# Patient Record
Sex: Male | Born: 1955 | Race: White | Hispanic: No | Smoking: Current every day smoker
Health system: Southern US, Community
[De-identification: ages and names within clinical notes are randomized; demographics above are authoritative.]

## PROBLEM LIST (undated history)

## (undated) DIAGNOSIS — K30 Functional dyspepsia: Secondary | ICD-10-CM

## (undated) DIAGNOSIS — K219 Gastro-esophageal reflux disease without esophagitis: Secondary | ICD-10-CM

## (undated) DIAGNOSIS — K210 Gastro-esophageal reflux disease with esophagitis: Secondary | ICD-10-CM

## (undated) DIAGNOSIS — R0602 Shortness of breath: Secondary | ICD-10-CM

## (undated) DIAGNOSIS — I10 Essential (primary) hypertension: Secondary | ICD-10-CM

## (undated) DIAGNOSIS — J449 Chronic obstructive pulmonary disease, unspecified: Secondary | ICD-10-CM

## (undated) DIAGNOSIS — R51 Headache: Secondary | ICD-10-CM

## (undated) HISTORY — PX: MANDIBLE FRACTURE SURGERY: SHX706

## (undated) HISTORY — PX: LAPAROSCOPIC GASTROTOMY W/ REPAIR OF ULCER: SUR772

---

## 2000-10-26 ENCOUNTER — Encounter: Payer: Self-pay | Admitting: Emergency Medicine

## 2000-10-26 ENCOUNTER — Emergency Department (HOSPITAL_COMMUNITY): Admission: EM | Admit: 2000-10-26 | Discharge: 2000-10-26 | Payer: Self-pay | Admitting: Unknown Physician Specialty

## 2005-10-24 ENCOUNTER — Ambulatory Visit (HOSPITAL_COMMUNITY): Admission: EM | Admit: 2005-10-24 | Discharge: 2005-10-25 | Payer: Self-pay | Admitting: Emergency Medicine

## 2005-12-06 ENCOUNTER — Ambulatory Visit (HOSPITAL_COMMUNITY): Admission: RE | Admit: 2005-12-06 | Discharge: 2005-12-06 | Payer: Self-pay | Admitting: Otolaryngology

## 2005-12-11 ENCOUNTER — Ambulatory Visit (HOSPITAL_BASED_OUTPATIENT_CLINIC_OR_DEPARTMENT_OTHER): Admission: RE | Admit: 2005-12-11 | Discharge: 2005-12-11 | Payer: Self-pay | Admitting: Otolaryngology

## 2008-12-24 ENCOUNTER — Emergency Department (HOSPITAL_COMMUNITY): Admission: EM | Admit: 2008-12-24 | Discharge: 2008-12-24 | Payer: Self-pay | Admitting: Emergency Medicine

## 2009-01-15 ENCOUNTER — Encounter (INDEPENDENT_AMBULATORY_CARE_PROVIDER_SITE_OTHER): Payer: Self-pay | Admitting: Family Medicine

## 2009-01-15 ENCOUNTER — Ambulatory Visit: Payer: Self-pay | Admitting: Internal Medicine

## 2009-01-15 LAB — CONVERTED CEMR LAB: Microalb, Ur: 4.04 mg/dL — ABNORMAL HIGH (ref 0.00–1.89)

## 2009-01-20 ENCOUNTER — Ambulatory Visit: Payer: Self-pay | Admitting: *Deleted

## 2009-06-17 ENCOUNTER — Ambulatory Visit: Payer: Self-pay | Admitting: Family Medicine

## 2009-06-17 LAB — CONVERTED CEMR LAB
ALT: 47 units/L (ref 0–53)
Albumin: 4 g/dL (ref 3.5–5.2)
CO2: 20 meq/L (ref 19–32)
Cholesterol: 212 mg/dL — ABNORMAL HIGH (ref 0–200)
Glucose, Bld: 423 mg/dL — ABNORMAL HIGH (ref 70–99)
Hemoglobin: 17.2 g/dL — ABNORMAL HIGH (ref 13.0–17.0)
Lymphocytes Relative: 31 % (ref 12–46)
Lymphs Abs: 3.5 10*3/uL (ref 0.7–4.0)
MCHC: 34.4 g/dL (ref 30.0–36.0)
Microalb, Ur: 3.2 mg/dL — ABNORMAL HIGH (ref 0.00–1.89)
Monocytes Absolute: 1.3 10*3/uL — ABNORMAL HIGH (ref 0.1–1.0)
Monocytes Relative: 12 % (ref 3–12)
Neutro Abs: 6.3 10*3/uL (ref 1.7–7.7)
Potassium: 4.3 meq/L (ref 3.5–5.3)
RBC: 5.22 M/uL (ref 4.22–5.81)
Sodium: 133 meq/L — ABNORMAL LOW (ref 135–145)
Total Protein: 7.3 g/dL (ref 6.0–8.3)
Triglycerides: 782 mg/dL — ABNORMAL HIGH (ref <150)
Vit D, 25-Hydroxy: 14 ng/mL — ABNORMAL LOW (ref 30–89)
WBC: 11.4 10*3/uL — ABNORMAL HIGH (ref 4.0–10.5)

## 2009-06-22 ENCOUNTER — Ambulatory Visit: Payer: Self-pay | Admitting: Family Medicine

## 2010-06-10 ENCOUNTER — Emergency Department (HOSPITAL_COMMUNITY): Payer: Self-pay

## 2010-06-10 ENCOUNTER — Inpatient Hospital Stay (HOSPITAL_COMMUNITY)
Admission: EM | Admit: 2010-06-10 | Discharge: 2010-06-16 | DRG: 639 | Disposition: A | Discharge status: Home / Self Care | Payer: Self-pay | Attending: Internal Medicine | Admitting: Internal Medicine

## 2010-06-10 DIAGNOSIS — E785 Hyperlipidemia, unspecified: Secondary | ICD-10-CM | POA: Diagnosis present

## 2010-06-10 DIAGNOSIS — F17200 Nicotine dependence, unspecified, uncomplicated: Secondary | ICD-10-CM | POA: Diagnosis present

## 2010-06-10 DIAGNOSIS — Z9119 Patient's noncompliance with other medical treatment and regimen: Secondary | ICD-10-CM

## 2010-06-10 DIAGNOSIS — D1739 Benign lipomatous neoplasm of skin and subcutaneous tissue of other sites: Secondary | ICD-10-CM | POA: Diagnosis present

## 2010-06-10 DIAGNOSIS — E1100 Type 2 diabetes mellitus with hyperosmolarity without nonketotic hyperglycemic-hyperosmolar coma (NKHHC): Principal | ICD-10-CM | POA: Diagnosis present

## 2010-06-10 DIAGNOSIS — I10 Essential (primary) hypertension: Secondary | ICD-10-CM | POA: Diagnosis present

## 2010-06-10 DIAGNOSIS — J449 Chronic obstructive pulmonary disease, unspecified: Secondary | ICD-10-CM | POA: Diagnosis present

## 2010-06-10 DIAGNOSIS — F1010 Alcohol abuse, uncomplicated: Secondary | ICD-10-CM | POA: Diagnosis present

## 2010-06-10 DIAGNOSIS — Z8711 Personal history of peptic ulcer disease: Secondary | ICD-10-CM

## 2010-06-10 DIAGNOSIS — Z91199 Patient's noncompliance with other medical treatment and regimen due to unspecified reason: Secondary | ICD-10-CM

## 2010-06-10 DIAGNOSIS — K219 Gastro-esophageal reflux disease without esophagitis: Secondary | ICD-10-CM | POA: Diagnosis present

## 2010-06-10 DIAGNOSIS — J4489 Other specified chronic obstructive pulmonary disease: Secondary | ICD-10-CM | POA: Diagnosis present

## 2010-06-10 DIAGNOSIS — F1410 Cocaine abuse, uncomplicated: Secondary | ICD-10-CM | POA: Diagnosis present

## 2010-06-10 LAB — BLOOD GAS, VENOUS
Bicarbonate: 22.4 mEq/L (ref 20.0–24.0)
Patient temperature: 98.6
pH, Ven: 7.434 — ABNORMAL HIGH (ref 7.250–7.300)
pO2, Ven: 55.7 mmHg — ABNORMAL HIGH (ref 30.0–45.0)

## 2010-06-10 LAB — GLUCOSE, CAPILLARY
Glucose-Capillary: 340 mg/dL — ABNORMAL HIGH (ref 70–99)
Glucose-Capillary: 285 mg/dL — ABNORMAL HIGH (ref 70–99)
Glucose-Capillary: 187 mg/dL — ABNORMAL HIGH (ref 70–99)

## 2010-06-10 LAB — TYPE AND SCREEN
ABO/RH(D): O POS
Antibody Screen: NEGATIVE

## 2010-06-10 LAB — COMPREHENSIVE METABOLIC PANEL
ALT: 78 U/L — ABNORMAL HIGH (ref 0–53)
Alkaline Phosphatase: 184 U/L — ABNORMAL HIGH (ref 39–117)
BUN: 13 mg/dL (ref 6–23)
CO2: 21 mEq/L (ref 19–32)
Chloride: 96 mEq/L (ref 96–112)
Glucose, Bld: 573 mg/dL (ref 70–99)
Potassium: 4.4 mEq/L (ref 3.5–5.1)
Sodium: 125 mEq/L — ABNORMAL LOW (ref 135–145)
Total Bilirubin: 0.9 mg/dL (ref 0.3–1.2)
Total Protein: 6.8 g/dL (ref 6.0–8.3)

## 2010-06-10 LAB — URINALYSIS, ROUTINE W REFLEX MICROSCOPIC
Bilirubin Urine: NEGATIVE
Hgb urine dipstick: NEGATIVE
Ketones, ur: NEGATIVE mg/dL
Nitrite: NEGATIVE
Specific Gravity, Urine: 1.035 — ABNORMAL HIGH (ref 1.005–1.030)
Urobilinogen, UA: 0.2 mg/dL (ref 0.0–1.0)

## 2010-06-10 LAB — LIPASE, BLOOD: Lipase: 35 U/L (ref 11–59)

## 2010-06-10 LAB — DIFFERENTIAL
Basophils Relative: 1 % (ref 0–1)
Eosinophils Absolute: 0.1 10*3/uL (ref 0.0–0.7)
Monocytes Relative: 10 % (ref 3–12)
Neutro Abs: 4.5 10*3/uL (ref 1.7–7.7)
Neutrophils Relative %: 55 % (ref 43–77)

## 2010-06-10 LAB — RAPID URINE DRUG SCREEN, HOSP PERFORMED
Amphetamines: NOT DETECTED
Barbiturates: NOT DETECTED
Opiates: NOT DETECTED

## 2010-06-10 LAB — CBC
Hemoglobin: 16.7 g/dL (ref 13.0–17.0)
MCH: 34.2 pg — ABNORMAL HIGH (ref 26.0–34.0)
MCHC: 36.9 g/dL — ABNORMAL HIGH (ref 30.0–36.0)
Platelets: 154 10*3/uL (ref 150–400)
RDW: 11.6 % (ref 11.5–15.5)

## 2010-06-10 LAB — ETHANOL: Alcohol, Ethyl (B): <5 mg/dL (ref 0–10)

## 2010-06-10 LAB — URINE MICROSCOPIC-ADD ON

## 2010-06-10 LAB — HEMOCCULT GUIAC POC 1CARD (OFFICE): Fecal Occult Bld: NEGATIVE

## 2010-06-10 LAB — CK TOTAL AND CKMB (NOT AT ARMC): CK, MB: 1.9 ng/mL (ref 0.3–4.0)

## 2010-06-10 LAB — TROPONIN I: Troponin I: 0.02 ng/mL (ref 0.00–0.06)

## 2010-06-11 ENCOUNTER — Inpatient Hospital Stay (HOSPITAL_COMMUNITY): Payer: Self-pay

## 2010-06-11 DIAGNOSIS — R0602 Shortness of breath: Secondary | ICD-10-CM

## 2010-06-11 LAB — LIPID PANEL
Triglycerides: 355 mg/dL — ABNORMAL HIGH (ref <150)
VLDL: 71 mg/dL — ABNORMAL HIGH (ref 0–40)

## 2010-06-11 LAB — CARDIAC PANEL(CRET KIN+CKTOT+MB+TROPI)
CK, MB: 1.2 ng/mL (ref 0.3–4.0)
CK, MB: 1.3 ng/mL (ref 0.3–4.0)
Relative Index: INVALID (ref 0.0–2.5)
Relative Index: INVALID (ref 0.0–2.5)
Total CK: 54 U/L (ref 7–232)
Total CK: 53 U/L (ref 7–232)
Troponin I: 0.01 ng/mL (ref 0.00–0.06)
Troponin I: 0.01 ng/mL (ref 0.00–0.06)

## 2010-06-11 LAB — HEMOGLOBIN A1C: Mean Plasma Glucose: 303 mg/dL — ABNORMAL HIGH (ref <117)

## 2010-06-11 LAB — COMPREHENSIVE METABOLIC PANEL
BUN: 8 mg/dL (ref 6–23)
CO2: 23 mEq/L (ref 19–32)
Chloride: 104 mEq/L (ref 96–112)
Creatinine, Ser: 0.8 mg/dL (ref 0.4–1.5)
GFR calc non Af Amer: >60 mL/min (ref >60)
Glucose, Bld: 132 mg/dL — ABNORMAL HIGH (ref 70–99)
Total Bilirubin: 0.7 mg/dL (ref 0.3–1.2)

## 2010-06-11 LAB — GLUCOSE, CAPILLARY
Glucose-Capillary: 126 mg/dL — ABNORMAL HIGH (ref 70–99)
Glucose-Capillary: 127 mg/dL — ABNORMAL HIGH (ref 70–99)
Glucose-Capillary: 170 mg/dL — ABNORMAL HIGH (ref 70–99)
Glucose-Capillary: 190 mg/dL — ABNORMAL HIGH (ref 70–99)
Glucose-Capillary: 216 mg/dL — ABNORMAL HIGH (ref 70–99)
Glucose-Capillary: 236 mg/dL — ABNORMAL HIGH (ref 70–99)

## 2010-06-11 LAB — CBC
Hemoglobin: 14.6 g/dL (ref 13.0–17.0)
MCH: 33.3 pg (ref 26.0–34.0)
MCV: 92.9 fL (ref 78.0–100.0)
Platelets: 134 10*3/uL — ABNORMAL LOW (ref 150–400)
RBC: 4.39 MIL/uL (ref 4.22–5.81)

## 2010-06-11 LAB — ABO/RH: ABO/RH(D): O POS

## 2010-06-11 MED ORDER — IOHEXOL 300 MG/ML  SOLN
125.0000 mL | Freq: Once | INTRAMUSCULAR | Status: AC | PRN
  Administered 2010-06-11: 125 mL via INTRAVENOUS

## 2010-06-12 LAB — CBC
MCH: 33.8 pg (ref 26.0–34.0)
MCHC: 36.4 g/dL — ABNORMAL HIGH (ref 30.0–36.0)
Platelets: 134 10*3/uL — ABNORMAL LOW (ref 150–400)

## 2010-06-12 LAB — BASIC METABOLIC PANEL
Calcium: 8.9 mg/dL (ref 8.4–10.5)
Creatinine, Ser: 0.85 mg/dL (ref 0.4–1.5)
GFR calc Af Amer: >60 mL/min (ref >60)
GFR calc non Af Amer: >60 mL/min (ref >60)
Sodium: 136 mEq/L (ref 135–145)

## 2010-06-12 LAB — GLUCOSE, CAPILLARY
Glucose-Capillary: 235 mg/dL — ABNORMAL HIGH (ref 70–99)
Glucose-Capillary: 254 mg/dL — ABNORMAL HIGH (ref 70–99)
Glucose-Capillary: 282 mg/dL — ABNORMAL HIGH (ref 70–99)
Glucose-Capillary: 288 mg/dL — ABNORMAL HIGH (ref 70–99)

## 2010-06-13 LAB — GLUCOSE, CAPILLARY
Glucose-Capillary: 240 mg/dL — ABNORMAL HIGH (ref 70–99)
Glucose-Capillary: 295 mg/dL — ABNORMAL HIGH (ref 70–99)
Glucose-Capillary: 305 mg/dL — ABNORMAL HIGH (ref 70–99)
Glucose-Capillary: 317 mg/dL — ABNORMAL HIGH (ref 70–99)
Glucose-Capillary: 344 mg/dL — ABNORMAL HIGH (ref 70–99)

## 2010-06-13 LAB — BASIC METABOLIC PANEL
BUN: 6 mg/dL (ref 6–23)
CO2: 25 mEq/L (ref 19–32)
Calcium: 9.8 mg/dL (ref 8.4–10.5)
Chloride: 102 mEq/L (ref 96–112)
Creatinine, Ser: 0.93 mg/dL (ref 0.4–1.5)
GFR calc Af Amer: >60 mL/min (ref >60)
GFR calc non Af Amer: >60 mL/min (ref >60)
Glucose, Bld: 292 mg/dL — ABNORMAL HIGH (ref 70–99)
Potassium: 3.3 mEq/L — ABNORMAL LOW (ref 3.5–5.1)
Sodium: 136 mEq/L (ref 135–145)

## 2010-06-14 LAB — GLUCOSE, CAPILLARY
Glucose-Capillary: 325 mg/dL — ABNORMAL HIGH (ref 70–99)
Glucose-Capillary: 366 mg/dL — ABNORMAL HIGH (ref 70–99)
Glucose-Capillary: 318 mg/dL — ABNORMAL HIGH (ref 70–99)

## 2010-06-14 LAB — COMPREHENSIVE METABOLIC PANEL
Albumin: 3.6 g/dL (ref 3.5–5.2)
BUN: 13 mg/dL (ref 6–23)
Calcium: 10.2 mg/dL (ref 8.4–10.5)
Chloride: 100 mEq/L (ref 96–112)
Creatinine, Ser: 0.97 mg/dL (ref 0.4–1.5)
GFR calc non Af Amer: >60 mL/min (ref >60)
Total Bilirubin: 0.9 mg/dL (ref 0.3–1.2)

## 2010-06-15 LAB — GLUCOSE, CAPILLARY
Glucose-Capillary: 303 mg/dL — ABNORMAL HIGH (ref 70–99)
Glucose-Capillary: 363 mg/dL — ABNORMAL HIGH (ref 70–99)
Glucose-Capillary: 218 mg/dL — ABNORMAL HIGH (ref 70–99)
Glucose-Capillary: 295 mg/dL — ABNORMAL HIGH (ref 70–99)

## 2010-06-15 NOTE — H&P (Signed)
NAME:  Carl Cooke, Carl Cooke                ACCOUNT NO.:  [id number]  MEDICAL RECORD NO.:  [id number]           PATIENT TYPE:  E  LOCATION:  WLED                         FACILITY:  [facility]  PHYSICIAN:  Thad Ranger, MD       DATE OF BIRTH:  12/14/1955  DATE OF ADMISSION:  06/10/2010 DATE OF DISCHARGE:                             HISTORY & PHYSICAL   PRIMARY CARE PHYSICIAN:  [facility]  CHIEF COMPLAINT:  High blood sugars and dizziness.  HISTORY OF PRESENT ILLNESS:  Carl Cooke is a 55 year old male with history of hypertension, diabetes mellitus type 2, hyperlipidemia, history of peptic ulcer disease, presented to the [facility] Emergency Room with chief complaint of high blood sugars.  History was provided by the patient and he states that he has ran out of his diabetes and all other medications.  He states that he has seen his physician last time was 1 year ago.  He states that his blood sugar has been elevated for last 10 months and in the last 2 weeks have been un-recordable.  He states that he cannot afford any of his medications.  He also states that he has been having some dizziness and lightheadedness for the last couple of weeks and today was unable to stand up.  He denies any syncopal episode.  He also endorses having off and on epigastric abdominal pain over the last 6 months.  He at this time states that the pain is about 6/10 intensity, generalized, but worse in epigastric region.  He has been having nausea every time he eats with vomiting and has been unable to hold the food down.  He states that 2 days ago he had some coffee-ground emesis.  He also had one episode of melanotic stools 2 days ago.  He states that he had an EGD 10 years ago, which had shown "stomach ulcer."  He was placed on Prilosec, but he ran out of all the medications.  He endorses having some shortness of breath on exertion. He states that he has been able to walk only half a block over the last 6-7  months.  He is a chronic smoker with heavy smoking, but not on any inhalers.  He also endorses having chronic headaches, neck pain.  Urine drug use in the emergency room was also positive for cocaine, which he used yesterday.  In the emergency room, patient was found to have blood sugar of 433.  PAST MEDICAL HISTORY: 1. Hypertension. 2. Diabetes mellitus type 2, 3. Hyperlipidemia. 4. Peptic ulcer disease. 5. History of polysubstance abuse with smoking, alcohol and cocaine     use.  ALLERGIES:  No known drug allergies.  MEDICATIONS:  None.  PHYSICAL EXAMINATION:  VITAL SIGNS:  Blood pressure 141/91, pulse rate 75, respiratory rate 28, temperature 98.3. GENERAL:  Patient is alert, awake and oriented x3, not in any acute distress. HEENT:  Anicteric sclarea.  Conjunctivae pink.  Pupils are equal and reactive to light and accommodation.  EOMI. NECK:  Supple.  Lipoma noted on the upper back. CHEST:  Clear to auscultation bilaterally. CVS:  S1, S2 clear with regular  rhythm. ABDOMEN:  Generalized tenderness to palpation,worse in epigastric region, soft, normal bowel sounds. EXTREMITIES:  No cyanosis, clubbing or edema noted bilaterally. NEUROLOGIC:  No focal neurological deficits noted.  Cranial II through XII intact.  DIAGNOSTIC STUDIES AND LABORATORY DATA:  Urine drug screen positive for cocaine.  CMP:  Sodium is 125, corrected sodium is 133 with glucose of 573, BUN 13, creatinine 1.01, alkaline phosphatase 184, AST 53, ALT 78, total bilirubin 0.9, calcium 9.6, albumin 3.7.  Alcohol level less than 5.  Lipase 35.  Urinalysis:  More than 1000 glucose, negative for UTI. CBC:  White count of 8.3, hemoglobin 16.7, hematocrit 45.3, platelets 154.  Fecal occult blood negative.  CK 83, MB 1.9.  Troponin 0.02. ketones negative.  Radiological data:  Acute abdominal series: 1. No acute abdominal abnormality. 2. Stable chronic bronchitis and/or asthma.  No acute cardiopulmonary      disease.  IMPRESSION AND PLAN:  Carl Cooke is a 55 year old male with known history of noncompliance, polysubstance abuse, history of diabetes mellitus, hypertension, hyperlipidemia and peptic ulcer disease, not on any medications, presenting with hyperglycemia, dizziness as well as abdominal pain. 1. Hyperosmolar nonketotic hyperglycemia with uncontrolled diabetes     mellitus:  Patient will be admitted, placed on intravenous insulin     and glucose stabilizer until blood sugars are better controlled.     Patient needs extensive diabetes teaching and compliance diabetic     diet.  He will need assistance with medications prior to the     discharge.  For now, we will continue on clear liquid diet. 2. Dyspnea on exertion with possible chronic obstructive pulmonary     disease and active tobacco abuse:  Patient has chronic dyspnea on     exertion, is a heavy smoker, but does not carry a diagnosis of     chronic obstructive pulmonary disease, which is likely present in     this patient.  For now, I will place him on nebulizer treatment.     He was counseled strongly on smoking cessation and should have     pulmonary function tests and home O2 evaluation prior to discharge. 3. Alcohol abuse with last drink yesterday:  Patient was also     counseled strongly on alcohol cessation.  For now, I will place him     on Ativan with [facility] Withdrawal Assessment protocol to     avoid withdrawals. 4. Abdominal pain with questionable hematemesis and melanotic stools:     Patient does have a history of peptic ulcer disease and was     prescribed proton pump inhibitor.  Given his generalized abdominal     pain, I will obtain computed tomography of abdomen and pelvis to     rule out any acute abdominal pathology, place on proton pump     inhibitor and clear.  He is currently hemodynamically stable and is     not having an active bleeding.  Consider gastrointestinal consult     in a.m. 5.  Hypertension, currently stable:  Place on p.r.n. hydralazine. 6. History of cocaine abuse:  Patient was again strongly counseled on     polysubstance abuse and we will also place substance abuse     counselor for cocaine cessation. 7. Prophylaxis:  Place on proton pump inhibitor and bilateral SCDs.     Thad Ranger, MD     RR/MEDQ  D:  06/10/2010  T:  06/10/2010  Job:  [id number]  Electronically Signed by Andres Labrum RAI  on 06/15/2010 03:17:18 PM

## 2010-06-16 LAB — COMPREHENSIVE METABOLIC PANEL
ALT: 40 U/L (ref 0–53)
AST: 30 U/L (ref 0–37)
Albumin: 3.4 g/dL — ABNORMAL LOW (ref 3.5–5.2)
Alkaline Phosphatase: 129 U/L — ABNORMAL HIGH (ref 39–117)
BUN: 19 mg/dL (ref 6–23)
Chloride: 101 mEq/L (ref 96–112)
Potassium: 4.2 mEq/L (ref 3.5–5.1)
Sodium: 134 mEq/L — ABNORMAL LOW (ref 135–145)
Total Bilirubin: 0.5 mg/dL (ref 0.3–1.2)
Total Protein: 6.9 g/dL (ref 6.0–8.3)

## 2010-06-16 LAB — GLUCOSE, CAPILLARY: Glucose-Capillary: >600 mg/dL (ref 70–99)

## 2010-06-17 NOTE — Progress Notes (Signed)
NAMESHJON, LIZARRAGA                ACCOUNT NO.:  [id number]  MEDICAL RECORD NO.:  [id number]           PATIENT TYPE:  I  LOCATION:  1514                         FACILITY:  [facility]  PHYSICIAN:  Pleas Koch, MD        DATE OF BIRTH:  November 11, 1955                                PROGRESS NOTE   CURRENT DIAGNOSES: 1. Hyperosmolar nonketotic state. 2. Chronic obstructive pulmonary disease. 3. Alcohol use. 4. Hypertension. 5. Possible likely gastroesophageal reflux. 6. Lipoma upper back.  DISCHARGE MEDICATIONS: Will be prescribed as per discharging physician's note.  PERTINENT IMAGING: 1. Acute abdominal series showed no acute abnormality.  Stable chronic     bronchitis and/or asthma.  No acute cardiopulmonary disease. 2. CT of the abdomen done June 11, 2010, showed hepatomegaly,     diffuse fatty infiltration of the liver and also showed mildly     enlarged upper abdominal lymph node, most likely reactive but     consider a CT followup in 3 months.  BRIEF HOSPITAL COURSE TO DATE: This is a 55 year old male with hypertension, diabetes mellitus type 2, hyperlipidemia, history of peptic ulcer disease who presented with a chief complaint of high blood sugar.  The patient states that he ran out of all of his medications.  He is diabetic and has run out of all of his medications and the last time that he saw a physician was 1 year ago. He states that blood sugars have been elevated for the past 10 months and for the last two weeks they have been not recorded.  He states that he cannot afford his medications.  He has also had some dizziness and lightheadedness.  He has been unable to stand up.  He denied any syncopal episode and has been having abdominal epigastric pain for the past 6 months.  He states that it is more in his epigastric region and he has had nausea every time he eats.  Two days ago he had some coffee ground emesis and also had one episode of melanotic stools two  days prior to admission.  He had an EGD 10 years ago, which showed a peptic ulcer and he was placed on Prilosec, but ran out of all of this.  He also states that he has been having some shortness of breath on exertion.  He is a chronic smoker, heavy smoking, but does not use any inhalers.  The patient also complains of having neck pain, likely secondary to a lipoma.  In the ER he was found to be positive for cocaine, which he apparently used yesterday.  PHYSICAL EXAMINATION ON ADMISSION: VITAL SIGNS:  Blood pressure 141/91, pulse rate 75, respirations 28, temperature 98.3. GENERAL:  Alert and oriented. SKIN:  He has a lipoma noted on the upper back about 10 cm around. ABDOMEN:  Generalized tenderness to palpation of the abdomen, worse in the epigastric region.  Normal soft bowel sounds. CARDIAC:  S1, S2.  No murmurs, rubs or gallops.  LABORATORY DATA: Admission drug screen was positive for cocaine.  CMP showed sodium was 133, glucose 570, BUN was 13, creatinine was 1.01, alkaline  phosphatase 184.  Transaminase AST was 53, ALT 78, total bilirubin 0.9, calcium 9.6, albumin 3.7.  Alcohol was less than 5.  Lipase was 35.  Glucose more than 1000.  Negative for UTI.  His white count was 8.3, hemoglobin 16.7, hematocrit 45.3, platelets 154.  Fecal occult blood negative.  Cardiac enzymes were negative.  HOSPITAL COURSE ACCORDING TO ISSUE: 1. The patient was in hyperosmolar nonketotic state.  He was placed on     Glucommander initially but quickly had sugars below 200s to 300s.     It has been difficult to manage his blood sugar in the hospital and     he has required escalating doses of insulin.  Please note that he     would not be a candidate for orals, given the fact that his HbA1c     was above 12.2.  He will likely need help with medications, which     we are trying to arrange, however, and he has been set up with     [facility] for followup.  However, he will need to have  a     good set up plan in place before discharge.  I have increased his     Lantus today to 32 units q.p.m. and mealtime coverage 8 units of     NovoLog.  He will also be on sliding-scale insulin to control his     blood sugar. 2. Abdominal pain.  This is likely secondary to his underlying reflux     disease.  I have placed him on Zantac and omeprazole but he still     has some pain.  We may be able to add sucralfate to help with his     medications.  I would recommend that if he still has pain. 3. COPD.  He came in with a strong smoking history, but has not     demonstrated any wheeze.  He has not really needed any inhalers     here.  However, his wife expressed concern about this and hence may     need albuterol p.r.n. on discharge. 4. Ethanol use.  The patient was counseled to quit drinking.  He     states that he does not drink that often.  He was kept on CIWA     scale initially transiently.  However, is doing fairly without that     right now and this can likely be discontinued. 5. Hypertension.  Note that his blood pressures on arrival were     elevated.  I have placed him on lisinopril/HCTZ.  Hopefully he can     go home on a combination medication regimen of these two     medications. 6. Lipoma.  The patient requested inpatient evaluation of this.     However, I have verbally spoken to PA, who recommended this being     done as an outpatient.  Vital signs on day of review were temperature 97.8, pulse was 75, respirations were 20, blood pressure was 116-130/73-91.  The patient was saturating 93% on room air.          ______________________________ Pleas Koch, MD     JS/MEDQ  D:  06/14/2010  T:  06/14/2010  Job:  [id number]  Electronically Signed by Pleas Koch MD on 06/17/2010 03:13:14 PM

## 2010-07-11 NOTE — Discharge Summary (Signed)
Carl Cooke, AGER NO.:  [id number]  MEDICAL RECORD NO.:  [id number]           PATIENT TYPE:  I  LOCATION:  1514                         FACILITY:  [facility]  PHYSICIAN:  Pincus Large, MD     DATE OF BIRTH:  [birth year]  DATE OF ADMISSION:  06/10/2010 DATE OF DISCHARGE:  06/16/2010                              DISCHARGE SUMMARY   PRIMARY CARE PHYSICIAN:  [facility].  TIME SPENT:  Time spent on discharge summary was 35 minutes.  DISCHARGE DIAGNOSES: 1. Hyperosmolar nonketotic state, resolved. 2. Diabetes type 2. 3. History of chronic obstructive pulmonary disease. 4. History of alcohol abuse. 5. Hypertension. 6. Gastroesophageal reflux disease. 7. Lipoma on the upper back.  DISCHARGE MEDICATIONS: 1. Albuterol 90 mcg inhaler 1 puff q.4 p.r.n. 2. Hydrochlorothiazide 12.5 daily. 3. Insulin NovoLog sliding scale. 4. Lantus 38 units q.h.s. 5. Ipratropium 0.2 mg per mL twice a day. 6. Lisinopril 10 daily. 7. Nicotine 21 mg q.24 h. 8. Protonix 40 daily. 9. Sucralfate 1 g by mouth twice daily.  IMAGING:  Current images that were done are: 1. Acute abdominal series showing no acute abnormalities. 2. CT of the abdomen on February 11th shows hepatomegaly, diffuse     fatty infiltration of the liver and showed mild enlarged upper     abdominal lymph nodes, most likely active, but consider repeated CT     in 3 months.  HOSPITAL COURSE:  He is a 55 year old male with hypertension, diabetes type 2, hyperlipidemia, history of peptic ulcer disease who presented with chief complaint of high blood sugar.  The patient states that he ran out of his home medication and the last time he saw a physician was 1 year ago.  His blood sugar has been elevated for the last 2 weeks. They have not been recorded.  He stated also that he cannot afford the medication.  He had some dizziness and lightheaded.  He was unable to stand up.  Denies any syncopal episode.  He also has had  some mild epigastric pain.  ADMISSION LABORATORY DATA:  Positive for cocaine.  His CMP shows sodium was 133, glucose was 570, BUN 13, creatinine was 1.01, alkaline phosphatase 182, AST was 53, ALT was 78, total bili was 0.9, calcium was 9.6.  Alcohol level was less than 5, lipase was 35.  Glucose was more than 1000 and negative for UTI on the urine.  His white blood cell was 8.3, hemoglobin was 16.7, and hematocrit was 45.3.  PHYSICAL EXAMINATION:  GENERAL:  The patient is awake and oriented x3, lying in bed, in on acute distress. HEENT:  Normocephalic, atraumatic.  Conjunctivae pink.  Pupils are equal and reactive to light. CARDIAC:  S1 and S2 are normal.  No gallop or rub. LUNGS:  Bilateral air entry.  No wheezes or crackles. ABDOMEN:  Soft.  Bowel sounds present. MUSCULOSKELETAL:  Motor strength of 5/5 in all extremities.  HOSPITAL COURSE ACCORDING TO PROBLEM: 1. The patient was in hyperosmolar nonketotic state.  He was placed on     Glucommander initially, but quickly sugar went down to low 200s to  300s.  It has been difficult to manage his blood sugar.  The     patient's A1c was 12.2.  His Lantus was increased up to 38 units.     Also, the patient was started on NovoLog premeal 8 units.  I have     asked the patient to follow up with his primary care at __________     in 3 months. 2. Abdominal pain, most likely esophageal reflux.  The patient was     started on omeprazole and Zantac and also sucralfate was added and     the patient felt much better. 3. Possible underlying chronic obstructive pulmonary disease.  The     patient was discharged on bronchodilator inhaler. 4. History of alcohol abuse.  The patient was counseled to quit     drinking. 5. Hypertension.  Lisinopril/hydrochlorothiazide was given to the     patient and his blood pressure was fairly controlled. 6. Lipoma.  The patient is recommended to follow up with the surgeon     as an outpatient.           ______________________________ Pincus Large, MD     SA/MEDQ  D:  06/16/2010  T:  06/17/2010  Job:  [id number]  Electronically Signed by Pincus Large MD on 07/11/2010 05:57:59 PM

## 2010-08-06 LAB — POCT I-STAT, CHEM 8
BUN: 10 mg/dL (ref 6–23)
Hemoglobin: 18 g/dL — ABNORMAL HIGH (ref 13.0–17.0)
Potassium: 4.2 mEq/L (ref 3.5–5.1)
Sodium: 130 mEq/L — ABNORMAL LOW (ref 135–145)
TCO2: 22 mmol/L (ref 0–100)

## 2010-08-06 LAB — GLUCOSE, CAPILLARY
Glucose-Capillary: 243 mg/dL — ABNORMAL HIGH (ref 70–99)
Glucose-Capillary: 577 mg/dL (ref 70–99)

## 2010-08-06 LAB — URINALYSIS, ROUTINE W REFLEX MICROSCOPIC
Bilirubin Urine: NEGATIVE
Glucose, UA: >1000 mg/dL — AB
Hgb urine dipstick: NEGATIVE
Protein, ur: NEGATIVE mg/dL
Urobilinogen, UA: 0.2 mg/dL (ref 0.0–1.0)

## 2010-08-06 LAB — KETONES, QUALITATIVE: Acetone, Bld: NEGATIVE

## 2010-08-06 LAB — URINE MICROSCOPIC-ADD ON
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
## 2010-09-16 NOTE — Op Note (Signed)
Carl Cooke, Carl Cooke                ACCOUNT NO.:  [id number]   MEDICAL RECORD NO.:  [id number]          PATIENT TYPE:  AMB   LOCATION:  DSC                          FACILITY:  [facility]   PHYSICIAN:  Karol T. Lazarus Salines, M.D. DATE OF BIRTH:  February 06, 1956   DATE OF PROCEDURE:  12/11/2005  DATE OF DISCHARGE:                                 OPERATIVE REPORT   PREOPERATIVE DIAGNOSIS:  Right subcondylar mandible fracture.   POSTOPERATIVE DIAGNOSIS:  Right subcondylar mandible fracture.   PROCEDURE PERFORMED:  Removal intermaxillary fixation.   SURGEON:  Gloris Manchester. Lazarus Salines, M.D.   ANESTHESIA:  General mask converted to general orotracheal.   BLOOD LOSS:  None.   COMPLICATIONS:  None.   FINDINGS:  Poor dentition with decent occlusion.  Routine wires and  bimaxillary bicortical screws removed without difficulty.   PROCEDURE:  With the patient in the comfortable supine position, general  mask anesthesia was administered.  At an appropriate level, the four wires  were cut and removed.  The jaw was able to open reasonably.  Anesthesia  performed orotracheal intubation.   At an appropriate level, 1% Xylocaine with 1:100,000 epinephrine, 6 mL total  was infiltrated into the sites of the four bicortical screws.  After  allowing several minutes for this to take effect, a Therapist, nutritional were used  to clean the mucosa over the screws.  All four screws were removed without  difficulty and passed off as specimens.  Hemostasis was spontaneous.  The  oral cavity was irrigated and suctioned clean.  At this point the procedure  was completed.  The patient was returned to Anesthesia, awakened, extubated,  and transferred to recovery in stable condition.   COMMENT:  A 55 year old white male sustained a right subcondylar mandible  fracture approximately 7 weeks ago.  He is here for removal of fixation.  After confirmation of good healing on Panorex.  Anticipate a routine  postoperative recovery with attention  oral hygiene, analgesia, and slow  advancement of diet.  Given low anticipated risk of postanesthetic or  postsurgical complications, I feel an outpatient venue is appropriate.      Gloris Manchester. Lazarus Salines, M.D.  Electronically Signed    KTW/MEDQ  D:  12/11/2005  T:  12/11/2005  Job:  [id number]

## 2011-07-11 ENCOUNTER — Encounter (HOSPITAL_COMMUNITY): Payer: Self-pay | Admitting: Emergency Medicine

## 2011-07-11 ENCOUNTER — Emergency Department (HOSPITAL_COMMUNITY)
Admission: EM | Admit: 2011-07-11 | Discharge: 2011-07-11 | Discharge status: Against Medical Advice | Payer: Self-pay | Attending: Emergency Medicine | Admitting: Emergency Medicine

## 2011-07-11 ENCOUNTER — Inpatient Hospital Stay (HOSPITAL_COMMUNITY)
Admission: EM | Admit: 2011-07-11 | Discharge: 2011-07-16 | DRG: 639 | Disposition: A | Discharge status: Home Health | Payer: MEDICAID | Attending: Internal Medicine | Admitting: Internal Medicine

## 2011-07-11 DIAGNOSIS — K2100 Gastro-esophageal reflux disease with esophagitis, without bleeding: Secondary | ICD-10-CM | POA: Diagnosis present

## 2011-07-11 DIAGNOSIS — K2970 Gastritis, unspecified, without bleeding: Secondary | ICD-10-CM | POA: Diagnosis present

## 2011-07-11 DIAGNOSIS — R109 Unspecified abdominal pain: Secondary | ICD-10-CM | POA: Diagnosis present

## 2011-07-11 DIAGNOSIS — R42 Dizziness and giddiness: Secondary | ICD-10-CM | POA: Insufficient documentation

## 2011-07-11 DIAGNOSIS — K7689 Other specified diseases of liver: Secondary | ICD-10-CM | POA: Diagnosis present

## 2011-07-11 DIAGNOSIS — K2990 Gastroduodenitis, unspecified, without bleeding: Secondary | ICD-10-CM | POA: Diagnosis present

## 2011-07-11 DIAGNOSIS — F1020 Alcohol dependence, uncomplicated: Secondary | ICD-10-CM | POA: Diagnosis present

## 2011-07-11 DIAGNOSIS — E1310 Other specified diabetes mellitus with ketoacidosis without coma: Principal | ICD-10-CM | POA: Diagnosis present

## 2011-07-11 DIAGNOSIS — B1920 Unspecified viral hepatitis C without hepatic coma: Secondary | ICD-10-CM | POA: Diagnosis present

## 2011-07-11 DIAGNOSIS — K2980 Duodenitis without bleeding: Secondary | ICD-10-CM | POA: Diagnosis present

## 2011-07-11 DIAGNOSIS — R0602 Shortness of breath: Secondary | ICD-10-CM | POA: Insufficient documentation

## 2011-07-11 DIAGNOSIS — Z6829 Body mass index (BMI) 29.0-29.9, adult: Secondary | ICD-10-CM

## 2011-07-11 DIAGNOSIS — K2270 Barrett's esophagus without dysplasia: Secondary | ICD-10-CM | POA: Diagnosis present

## 2011-07-11 DIAGNOSIS — K30 Functional dyspepsia: Secondary | ICD-10-CM | POA: Diagnosis present

## 2011-07-11 DIAGNOSIS — K3189 Other diseases of stomach and duodenum: Secondary | ICD-10-CM | POA: Diagnosis present

## 2011-07-11 DIAGNOSIS — Z794 Long term (current) use of insulin: Secondary | ICD-10-CM

## 2011-07-11 DIAGNOSIS — Z79899 Other long term (current) drug therapy: Secondary | ICD-10-CM

## 2011-07-11 DIAGNOSIS — E1110 Type 2 diabetes mellitus with ketoacidosis without coma: Secondary | ICD-10-CM | POA: Diagnosis present

## 2011-07-11 DIAGNOSIS — R1013 Epigastric pain: Secondary | ICD-10-CM | POA: Diagnosis present

## 2011-07-11 DIAGNOSIS — K3184 Gastroparesis: Secondary | ICD-10-CM | POA: Diagnosis present

## 2011-07-11 DIAGNOSIS — F17200 Nicotine dependence, unspecified, uncomplicated: Secondary | ICD-10-CM | POA: Diagnosis present

## 2011-07-11 DIAGNOSIS — Z885 Allergy status to narcotic agent status: Secondary | ICD-10-CM

## 2011-07-11 DIAGNOSIS — R7989 Other specified abnormal findings of blood chemistry: Secondary | ICD-10-CM | POA: Diagnosis present

## 2011-07-11 DIAGNOSIS — R112 Nausea with vomiting, unspecified: Secondary | ICD-10-CM | POA: Diagnosis present

## 2011-07-11 DIAGNOSIS — D131 Benign neoplasm of stomach: Secondary | ICD-10-CM | POA: Diagnosis present

## 2011-07-11 DIAGNOSIS — E785 Hyperlipidemia, unspecified: Secondary | ICD-10-CM | POA: Diagnosis present

## 2011-07-11 DIAGNOSIS — I10 Essential (primary) hypertension: Secondary | ICD-10-CM | POA: Diagnosis present

## 2011-07-11 HISTORY — DX: Headache: R51

## 2011-07-11 HISTORY — DX: Gastro-esophageal reflux disease with esophagitis: K21.0

## 2011-07-11 HISTORY — DX: Functional dyspepsia: K30

## 2011-07-11 HISTORY — DX: Chronic obstructive pulmonary disease, unspecified: J44.9

## 2011-07-11 HISTORY — DX: Gastro-esophageal reflux disease without esophagitis: K21.9

## 2011-07-11 HISTORY — DX: Shortness of breath: R06.02

## 2011-07-11 HISTORY — DX: Essential (primary) hypertension: I10

## 2011-07-11 LAB — DIFFERENTIAL
Basophils Absolute: 0 10*3/uL (ref 0.0–0.1)
Eosinophils Absolute: 0.1 10*3/uL (ref 0.0–0.7)
Eosinophils Absolute: 0.1 10*3/uL (ref 0.0–0.7)
Eosinophils Relative: 1 % (ref 0–5)
Lymphs Abs: 4.1 10*3/uL — ABNORMAL HIGH (ref 0.7–4.0)
Monocytes Relative: 10 % (ref 3–12)
Neutrophils Relative %: 58 % (ref 43–77)
Neutrophils Relative %: 55 % (ref 43–77)

## 2011-07-11 LAB — BASIC METABOLIC PANEL
BUN: 14 mg/dL (ref 6–23)
Chloride: 92 mEq/L — ABNORMAL LOW (ref 96–112)
GFR calc non Af Amer: >90 mL/min (ref >90)
Glucose, Bld: 351 mg/dL — ABNORMAL HIGH (ref 70–99)
Potassium: 3.7 mEq/L (ref 3.5–5.1)

## 2011-07-11 LAB — CBC
HCT: 45.4 % (ref 39.0–52.0)
Hemoglobin: 16.8 g/dL (ref 13.0–17.0)
MCH: 33.5 pg (ref 26.0–34.0)
MCH: 33.4 pg (ref 26.0–34.0)
MCV: 91 fL (ref 78.0–100.0)
Platelets: 193 10*3/uL (ref 150–400)
RBC: 5.03 MIL/uL (ref 4.22–5.81)
RDW: 11.8 % (ref 11.5–15.5)
WBC: 11.6 10*3/uL — ABNORMAL HIGH (ref 4.0–10.5)

## 2011-07-11 LAB — COMPREHENSIVE METABOLIC PANEL
ALT: 67 U/L — ABNORMAL HIGH (ref 0–53)
AST: 54 U/L — ABNORMAL HIGH (ref 0–37)
Albumin: 3.9 g/dL (ref 3.5–5.2)
Calcium: 10 mg/dL (ref 8.4–10.5)
Sodium: 129 mEq/L — ABNORMAL LOW (ref 135–145)
Total Protein: 7.6 g/dL (ref 6.0–8.3)

## 2011-07-11 NOTE — ED Notes (Signed)
PT. REPORTS GENERALIZED ABDOMINAL PAIN WITH OCCASIONAL VOMITTING AND DIARRHEA FOR 2 MONTHS .

## 2011-07-11 NOTE — ED Notes (Signed)
PT. REPORTS LIGHTHEADED / DIZZINESS / VOMITTING WITH SOB AND LATERAL ABDOMINAL PAIN FOR SEVERAL DAYS , ALSO REPORTS FINGERS ARE NUMB.

## 2011-07-12 ENCOUNTER — Encounter (HOSPITAL_COMMUNITY): Payer: Self-pay | Admitting: Internal Medicine

## 2011-07-12 ENCOUNTER — Inpatient Hospital Stay (HOSPITAL_COMMUNITY): Payer: Self-pay

## 2011-07-12 ENCOUNTER — Emergency Department (HOSPITAL_COMMUNITY): Payer: Self-pay

## 2011-07-12 DIAGNOSIS — R109 Unspecified abdominal pain: Secondary | ICD-10-CM | POA: Diagnosis present

## 2011-07-12 DIAGNOSIS — R7989 Other specified abnormal findings of blood chemistry: Secondary | ICD-10-CM

## 2011-07-12 DIAGNOSIS — E1110 Type 2 diabetes mellitus with ketoacidosis without coma: Secondary | ICD-10-CM | POA: Diagnosis present

## 2011-07-12 DIAGNOSIS — F1020 Alcohol dependence, uncomplicated: Secondary | ICD-10-CM | POA: Diagnosis present

## 2011-07-12 DIAGNOSIS — R112 Nausea with vomiting, unspecified: Secondary | ICD-10-CM | POA: Diagnosis present

## 2011-07-12 LAB — BASIC METABOLIC PANEL
BUN: 10 mg/dL (ref 6–23)
BUN: 9 mg/dL (ref 6–23)
CO2: 22 mEq/L (ref 19–32)
CO2: 20 mEq/L (ref 19–32)
CO2: 20 mEq/L (ref 19–32)
Calcium: 8.7 mg/dL (ref 8.4–10.5)
Calcium: 8.8 mg/dL (ref 8.4–10.5)
Calcium: 8.8 mg/dL (ref 8.4–10.5)
Chloride: 103 mEq/L (ref 96–112)
Chloride: 101 mEq/L (ref 96–112)
Chloride: 100 mEq/L (ref 96–112)
Creatinine, Ser: 0.66 mg/dL (ref 0.50–1.35)
Creatinine, Ser: 0.65 mg/dL (ref 0.50–1.35)
Creatinine, Ser: 0.63 mg/dL (ref 0.50–1.35)
GFR calc Af Amer: >90 mL/min (ref >90)
GFR calc Af Amer: >90 mL/min (ref >90)
GFR calc Af Amer: >90 mL/min (ref >90)
GFR calc non Af Amer: >90 mL/min (ref >90)
GFR calc non Af Amer: >90 mL/min (ref >90)
Glucose, Bld: 223 mg/dL — ABNORMAL HIGH (ref 70–99)
Glucose, Bld: 202 mg/dL — ABNORMAL HIGH (ref 70–99)
Potassium: 3.7 mEq/L (ref 3.5–5.1)
Potassium: 3.6 mEq/L (ref 3.5–5.1)
Sodium: 133 mEq/L — ABNORMAL LOW (ref 135–145)
Sodium: 130 mEq/L — ABNORMAL LOW (ref 135–145)
Sodium: 131 mEq/L — ABNORMAL LOW (ref 135–145)

## 2011-07-12 LAB — GLUCOSE, CAPILLARY
Glucose-Capillary: 275 mg/dL — ABNORMAL HIGH (ref 70–99)
Glucose-Capillary: 258 mg/dL — ABNORMAL HIGH (ref 70–99)
Glucose-Capillary: 242 mg/dL — ABNORMAL HIGH (ref 70–99)
Glucose-Capillary: 227 mg/dL — ABNORMAL HIGH (ref 70–99)
Glucose-Capillary: 229 mg/dL — ABNORMAL HIGH (ref 70–99)
Glucose-Capillary: 181 mg/dL — ABNORMAL HIGH (ref 70–99)
Glucose-Capillary: 133 mg/dL — ABNORMAL HIGH (ref 70–99)

## 2011-07-12 LAB — CBC
HCT: 41.6 % (ref 39.0–52.0)
Platelets: 151 10*3/uL (ref 150–400)
RBC: 4.58 MIL/uL (ref 4.22–5.81)
RDW: 12 % (ref 11.5–15.5)
WBC: 11.4 10*3/uL — ABNORMAL HIGH (ref 4.0–10.5)

## 2011-07-12 LAB — URINALYSIS, ROUTINE W REFLEX MICROSCOPIC
Bilirubin Urine: NEGATIVE
Glucose, UA: >1000 mg/dL — AB
Hgb urine dipstick: NEGATIVE
Nitrite: NEGATIVE
Specific Gravity, Urine: 1.044 — ABNORMAL HIGH (ref 1.005–1.030)
pH: 5.5 (ref 5.0–8.0)

## 2011-07-12 LAB — CARDIAC PANEL(CRET KIN+CKTOT+MB+TROPI)
CK, MB: 1.7 ng/mL (ref 0.3–4.0)
Relative Index: INVALID (ref 0.0–2.5)
Troponin I: <0.3 ng/mL (ref <0.30)

## 2011-07-12 LAB — HEPATIC FUNCTION PANEL
AST: 47 U/L — ABNORMAL HIGH (ref 0–37)
Albumin: 3.7 g/dL (ref 3.5–5.2)
Alkaline Phosphatase: 160 U/L — ABNORMAL HIGH (ref 39–117)
Total Bilirubin: 0.4 mg/dL (ref 0.3–1.2)

## 2011-07-12 MED ORDER — POTASSIUM CHLORIDE 10 MEQ/100ML IV SOLN
10.0000 meq | INTRAVENOUS | Status: DC
  Administered 2011-07-12: 10 meq via INTRAVENOUS
  Filled 2011-07-12 (×3): qty 100

## 2011-07-12 MED ORDER — SODIUM CHLORIDE 0.9 % IV SOLN
INTRAVENOUS | Status: DC
  Administered 2011-07-12: 2.2 [IU]/h via INTRAVENOUS
  Filled 2011-07-12: qty 1

## 2011-07-12 MED ORDER — IOHEXOL 300 MG/ML  SOLN
100.0000 mL | Freq: Once | INTRAMUSCULAR | Status: AC | PRN
  Administered 2011-07-12: 100 mL via INTRAVENOUS

## 2011-07-12 MED ORDER — DEXTROSE 50 % IV SOLN: 25.0000 mL | INTRAVENOUS | Status: DC | PRN

## 2011-07-12 MED ORDER — LORAZEPAM 2 MG/ML IJ SOLN: 1.0000 mg | Freq: Four times a day (QID) | INTRAMUSCULAR | Status: AC | PRN

## 2011-07-12 MED ORDER — ADULT MULTIVITAMIN W/MINERALS CH
1.0000 | ORAL_TABLET | Freq: Every day | ORAL | Status: DC
  Administered 2011-07-12 – 2011-07-15 (×3): 1 via ORAL
  Filled 2011-07-12 (×5): qty 1

## 2011-07-12 MED ORDER — INSULIN GLARGINE 100 UNIT/ML ~~LOC~~ SOLN
40.0000 [IU] | Freq: Every day | SUBCUTANEOUS | Status: AC
  Administered 2011-07-12 – 2011-07-13 (×2): 40 [IU] via SUBCUTANEOUS

## 2011-07-12 MED ORDER — PANTOPRAZOLE SODIUM 40 MG IV SOLR
40.0000 mg | Freq: Two times a day (BID) | INTRAVENOUS | Status: DC
  Administered 2011-07-12 (×2): 40 mg via INTRAVENOUS
  Filled 2011-07-12 (×4): qty 40

## 2011-07-12 MED ORDER — MORPHINE SULFATE 4 MG/ML IJ SOLN
INTRAMUSCULAR | Status: AC
  Filled 2011-07-12: qty 1

## 2011-07-12 MED ORDER — VITAMIN B-1 100 MG PO TABS
100.0000 mg | ORAL_TABLET | Freq: Every day | ORAL | Status: DC
  Administered 2011-07-12 – 2011-07-15 (×3): 100 mg via ORAL
  Filled 2011-07-12 (×5): qty 1

## 2011-07-12 MED ORDER — SODIUM CHLORIDE 0.9 % IV SOLN
INTRAVENOUS | Status: AC
  Administered 2011-07-12 – 2011-07-14 (×4): via INTRAVENOUS

## 2011-07-12 MED ORDER — DEXTROSE-NACL 5-0.45 % IV SOLN
INTRAVENOUS | Status: DC
  Administered 2011-07-12: 13:00:00 via INTRAVENOUS

## 2011-07-12 MED ORDER — THIAMINE HCL 100 MG/ML IJ SOLN
100.0000 mg | Freq: Every day | INTRAMUSCULAR | Status: DC
  Filled 2011-07-12 (×5): qty 1

## 2011-07-12 MED ORDER — LORAZEPAM 0.5 MG PO TABS: 1.0000 mg | ORAL_TABLET | Freq: Four times a day (QID) | ORAL | Status: AC | PRN

## 2011-07-12 MED ORDER — SODIUM CHLORIDE 0.9 % IV SOLN
INTRAVENOUS | Status: DC
  Administered 2011-07-12: 09:00:00 via INTRAVENOUS

## 2011-07-12 MED ORDER — DEXTROSE-NACL 5-0.45 % IV SOLN: INTRAVENOUS | Status: DC

## 2011-07-12 MED ORDER — INSULIN GLARGINE 100 UNIT/ML ~~LOC~~ SOLN: 40.0000 [IU] | Freq: Every day | SUBCUTANEOUS | Status: DC

## 2011-07-12 MED ORDER — INSULIN ASPART 100 UNIT/ML ~~LOC~~ SOLN
0.0000 [IU] | Freq: Three times a day (TID) | SUBCUTANEOUS | Status: DC
  Administered 2011-07-13 – 2011-07-14 (×2): 8 [IU] via SUBCUTANEOUS
  Administered 2011-07-14 (×2): 5 [IU] via SUBCUTANEOUS
  Administered 2011-07-15: 3 [IU] via SUBCUTANEOUS
  Administered 2011-07-15: 11 [IU] via SUBCUTANEOUS
  Administered 2011-07-15: 15 [IU] via SUBCUTANEOUS
  Administered 2011-07-16: 8 [IU] via SUBCUTANEOUS

## 2011-07-12 MED ORDER — SODIUM CHLORIDE 0.9 % IV SOLN: INTRAVENOUS | Status: AC

## 2011-07-12 MED ORDER — FOLIC ACID 1 MG PO TABS
1.0000 mg | ORAL_TABLET | Freq: Every day | ORAL | Status: DC
  Administered 2011-07-12 – 2011-07-15 (×3): 1 mg via ORAL
  Filled 2011-07-12 (×5): qty 1

## 2011-07-12 MED ORDER — METOCLOPRAMIDE HCL 5 MG/ML IJ SOLN
10.0000 mg | Freq: Four times a day (QID) | INTRAMUSCULAR | Status: DC | PRN
  Filled 2011-07-12: qty 2

## 2011-07-12 MED ORDER — METOCLOPRAMIDE HCL 5 MG/ML IJ SOLN
10.0000 mg | Freq: Once | INTRAMUSCULAR | Status: AC
  Administered 2011-07-12: 10 mg via INTRAVENOUS
  Filled 2011-07-12: qty 2

## 2011-07-12 MED ORDER — IOHEXOL 300 MG/ML  SOLN
20.0000 mL | INTRAMUSCULAR | Status: AC
  Administered 2011-07-12 (×2): 20 mL via ORAL

## 2011-07-12 NOTE — ED Notes (Signed)
Pt.cbg251mg .

## 2011-07-12 NOTE — ED Notes (Signed)
Patient is resting comfortably. 

## 2011-07-12 NOTE — ED Notes (Signed)
Epic down time Morphine given per MD order per paper chart. Unable to scan due to down time

## 2011-07-12 NOTE — ED Provider Notes (Signed)
History     CSN: [id number]  Arrival date & time 07/11/11  2257   First MD Initiated Contact with Patient 07/12/11 0025      Chief Complaint  Patient presents with  . Abdominal Pain    Patient is a 56 y.o. male presenting with abdominal pain.  Abdominal Pain The primary symptoms of the illness include abdominal pain, nausea, vomiting and diarrhea. The primary symptoms of the illness do not include fever or dysuria.  Additional symptoms associated with the illness include frequency. Symptoms associated with the illness do not include chills.   History provided by the patient. Patient is a 56 year old male presents with concerns for elevated blood sugar and uncontrolled diabetes as well as generalized abdominal pains and cramps. Patient reports being off diabetes medications or one to 2 months. He does report using his wife's Lantus medication occasionally. States his sugar has been high at home he checks. Patient also reports having a generalized nausea and decreased appetite several days. He has had occasional episodes of vomiting. Patient also reports having some loose stool for several months. He has associated polyuria polydipsia. Patient denies any aggravating or alleviating factors. He denies having fever, chills, sweats. Described as moderate and severe at times.    Past Medical History  Diagnosis Date  . Diabetes mellitus   . Hypertension     History reviewed. No pertinent past surgical history.  No family history on file.  History  Substance Use Topics  . Smoking status: Never Smoker   . Smokeless tobacco: Not on file  . Alcohol Use: No      Review of Systems  Constitutional: Positive for appetite change. Negative for fever and chills.  Gastrointestinal: Positive for nausea, vomiting, abdominal pain and diarrhea.  Genitourinary: Positive for frequency. Negative for dysuria.  All other systems reviewed and are negative.    Allergies  Review of patient's  allergies indicates no known allergies.  Home Medications  No current outpatient prescriptions on file.  BP 123/77  Pulse 86  Temp(Src) 98.4 F (36.9 C) (Oral)  Resp 20  SpO2 93%  Physical Exam  Nursing note and vitals reviewed. Constitutional: He is oriented to person, place, and time. He appears well-developed and well-nourished. No distress.  HENT:  Head: Normocephalic.  Neck:       No meningeal signs  Cardiovascular: Normal rate and regular rhythm.   Pulmonary/Chest: Effort normal and breath sounds normal. No respiratory distress. He has no wheezes.  Abdominal: Soft. He exhibits distension. There is tenderness. There is no rebound and no guarding.       Diffuse tenderness  Musculoskeletal: He exhibits no edema and no tenderness.  Neurological: He is alert and oriented to person, place, and time.  Skin: Skin is warm.  Psychiatric: He has a normal mood and affect. His behavior is normal.    ED Course  Procedures    Results for orders placed during the hospital encounter of 07/11/11  CBC      Component Value Range   WBC 12.1 (*) 4.0 - 10.5 (K/uL)   RBC 5.03  4.22 - 5.81 (MIL/uL)   Hemoglobin 16.8  13.0 - 17.0 (g/dL)   HCT 78.2  95.6 - 21.3 (%)   MCV 90.3  78.0 - 100.0 (fL)   MCH 33.4  26.0 - 34.0 (pg)   MCHC 37.0 (*) 30.0 - 36.0 (g/dL)   RDW 08.6  57.8 - 46.9 (%)   Platelets 182  150 - 400 (K/uL)  DIFFERENTIAL  Component Value Range   Neutrophils Relative 55  43 - 77 (%)   Neutro Abs 6.7  1.7 - 7.7 (K/uL)   Lymphocytes Relative 34  12 - 46 (%)   Lymphs Abs 4.1 (*) 0.7 - 4.0 (K/uL)   Monocytes Relative 10  3 - 12 (%)   Monocytes Absolute 1.2 (*) 0.1 - 1.0 (K/uL)   Eosinophils Relative 1  0 - 5 (%)   Eosinophils Absolute 0.1  0.0 - 0.7 (K/uL)   Basophils Relative 0  0 - 1 (%)   Basophils Absolute 0.0  0.0 - 0.1 (K/uL)  BASIC METABOLIC PANEL      Component Value Range   Sodium 129 (*) 135 - 145 (mEq/L)   Potassium 3.7  3.5 - 5.1 (mEq/L)   Chloride 92 (*)  96 - 112 (mEq/L)   CO2 19  19 - 32 (mEq/L)   Glucose, Bld 351 (*) 70 - 99 (mg/dL)   BUN 14  6 - 23 (mg/dL)   Creatinine, Ser 8.29  0.50 - 1.35 (mg/dL)   Calcium 56.2  8.4 - 10.5 (mg/dL)   GFR calc non Af Amer >90  >90 (mL/min)   GFR calc Af Amer >90  >90 (mL/min)  GLUCOSE, CAPILLARY      Component Value Range   Glucose-Capillary 304 (*) 70 - 99 (mg/dL)     Dg Abd Acute W/chest  07/12/2011  *RADIOLOGY REPORT*  Clinical Data: Abdominal pain  ACUTE ABDOMEN SERIES (ABDOMEN 2 VIEW & CHEST 1 VIEW)  Comparison: 06/10/2010  Findings: Low lung volumes.  Vascular congestion.  Upper normal heart size.  No free intraperitoneal gas.  No disproportionate dilatation of bowel.  Nonspecific small bowel and gastric air fluid levels are noted.  IMPRESSION: No evidence of free intraperitoneal gas.  Nonobstructive bowel gas pattern.  Low lung volumes.  Vascular congestion.  Original Report Authenticated By: Donavan Burnet, M.D.     1. DKA (diabetic ketoacidoses)       MDM  1 AM patient seen and evaluated. Patient in no acute distress.   Patient with signs for DKA. He should discuss with attending physician, Dr. Radford Pax plan to admit.  Spoke with triad hospitalist. They'll admit patient to telemetry bed under team 3.        Angus Seller, [place] 07/12/11 [phone]

## 2011-07-12 NOTE — Progress Notes (Signed)
Utilization review completed. Carl Cooke 07/12/2011

## 2011-07-12 NOTE — Consult Note (Signed)
Referring Provider: Dr. Jan Fireman Primary Care Physician:  Sheila Oats, MD, MD Primary Gastroenterologist:  none  Reason for Consultation:  Epigastric pain  HPI: Carl Cooke is a 56 y.o. male with history of adult-onset diabetes mellitus and hypertension as well as hyperlipidemia. He states he has not been taking any of his medications for several months because he cannot afford them. He was admitted to the hospital earlier today with complaints of epigastric pain nausea and vomiting in the setting of poorly controlled diabetes with a glucose of greater than 350 on arrival and mild DKA.  Patient relates previous history of peptic ulcer disease with bleed approximately 10 years ago while he was in prison. He had an endoscopy that did not require surgery. The patient has not had an ulcer documented since. He says he has been having intermittent problems with abdominal pain over the past 6 months and develops upper abdominal pressure and discomfort on a daily basis. His appetite has been decreased and he says lost about 25 pounds. He denies any dysphagia or a benign aphasia. Pieces more recently has been vomiting on a daily basis primarily in the mornings. He has not had any hematemesis melena or hematochezia that he is aware of. He does use Goody powders 1-2 daily takes occasional Alka-Seltzer and also drinks about 40 ounces of beer daily.  He is unaware of any prior history of liver disease or hepatitis. He is a smoker. He feels somewhat better today he feels that he probably could keep down some by mouth.  CT scan of the abdomen and pelvis which was done on 06/12/2011 showed a fatty liver and also some mildly enlarged periportal nodes which were felt to be reactive but followup CT in 3 months is recommended.  Review of labs done within the past 24 hours that showed a transaminitis. Hemoglobin is 15.2, and there is no laboratory evidence for cirrhosis.   Past Medical History    Diagnosis Date  . Diabetes mellitus   . Hypertension     History reviewed. No pertinent past surgical history.  Prior to Admission medications   Not on File    Current Facility-Administered Medications  Medication Dose Route Frequency Provider Last Rate Last Dose  . 0.9 %  sodium chloride infusion   Intravenous Continuous Eduard Clos, MD      . 0.9 %  sodium chloride infusion   Intravenous Continuous Rodolph Bong, MD 100 mL/hr at 07/12/11 1115    . dextrose 5 %-0.45 % sodium chloride infusion   Intravenous Continuous Eduard Clos, MD      . dextrose 50 % solution 25 mL  25 mL Intravenous PRN Eduard Clos, MD      . folic acid (FOLVITE) tablet 1 mg  1 mg Oral Daily Eduard Clos, MD   1 mg at 07/12/11 1011  . insulin regular (NOVOLIN R,HUMULIN R) 1 Units/mL in sodium chloride 0.9 % 100 mL infusion   Intravenous Continuous Eduard Clos, MD 5.5 mL/hr at 07/12/11 1115 5.5 Units/hr at 07/12/11 1115  . iohexol (OMNIPAQUE) 300 MG/ML solution 20 mL  20 mL Oral Q1 Hr x 2 Medication Radiologist, MD   20 mL at 07/12/11 1115  . LORazepam (ATIVAN) tablet 1 mg  1 mg Oral Q6H PRN Eduard Clos, MD       Or  . LORazepam (ATIVAN) injection 1 mg  1 mg Intravenous Q6H PRN Eduard Clos, MD      . metoCLOPramide (  REGLAN) injection 10 mg  10 mg Intravenous Once Angus Seller, PA   10 mg at 07/12/11 0241  . mulitivitamin with minerals tablet 1 tablet  1 tablet Oral Daily Eduard Clos, MD   1 tablet at 07/12/11 1011  . pantoprazole (PROTONIX) injection 40 mg  40 mg Intravenous Q12H Eduard Clos, MD   40 mg at 07/12/11 1131  . thiamine (VITAMIN B-1) tablet 100 mg  100 mg Oral Daily Eduard Clos, MD   100 mg at 07/12/11 1012   Or  . thiamine (B-1) injection 100 mg  100 mg Intravenous Daily Eduard Clos, MD      . DISCONTD: potassium chloride 10 mEq in 100 mL IVPB  10 mEq Intravenous Q1H Eduard Clos, MD   10 mEq at 07/12/11  0913   Facility-Administered Medications Ordered in Other Encounters  Medication Dose Route Frequency Provider Last Rate Last Dose  . morphine 4 MG/ML injection             Allergies as of 07/11/2011  . (No Known Allergies)    Family History  Problem Relation Age of Onset  . Diabetes type II Brother     History   Social History  . Marital Status: Single    Spouse Name: N/A    Number of Children: N/A  . Years of Education: N/A   Occupational History  . Not on file.   Social History Main Topics  . Smoking status: Current Everyday Smoker  . Smokeless tobacco: Not on file  . Alcohol Use: Yes  . Drug Use: No  . Sexually Active:    Other Topics Concern  . Not on file   Social History Narrative  . No narrative on file    Review of Systems: Pertinent positive and negative review of systems were noted in the above HPI section.  All other review of systems was otherwise negative. Physical Exam: Vital signs in last 24 hours: Temp:  [97.3 F (36.3 C)-98.4 F (36.9 C)] 98.2 F (36.8 C) (03/13 0735) Pulse Rate:  [73-92] 73  (03/13 0735) Resp:  [20-22] 20  (03/13 0735) BP: (123-133)/(77-84) 130/83 mmHg (03/13 0735) SpO2:  [93 %-100 %] 94 % (03/13 0735) Weight:  [195 lb (88.451 kg)] 195 lb (88.451 kg) (03/13 0735) Last BM Date: 07/11/11 General:   Alert,  Well-developed, well-nourished, pleasant and cooperative in NAD Head:  Normocephalic and atraumatic. Eyes:  Sclera clear, no icterus.   Conjunctiva pink. Ears:  Normal auditory acuity. Nose:  No deformity, discharge,  or lesions. Mouth:  No deformity or lesions.   Neck:  Supple; no masses or thyromegaly. Lungs:  Clear throughout to auscultation.   No wheezes, crackles, or rhonchi. Heart:  Regular rate and rhythm; no murmurs, clicks, rubs,  or gallops. Abdomen:  Soft,tender across upper abdomen, some guarding in epigastrium, no palp   mass or hsm, bs+ Rectal; not done Msk:  Symmetrical without gross deformities.  . Pulses:  Normal pulses noted. Extremities:  Without clubbing or edema.+tattoes Neurologic:  Alert and  oriented x4;  grossly normal neurologically. Skin:  Intact without significant lesions or rashes.. Psych:  Alert and cooperative. Normal mood and affect.  Intake/Output from previous day: 03/12 0701 - 03/13 0700 In: 2000 [I.V.:2000] Out: 400 [Urine:400] Intake/Output this shift: Total I/O In: 465 [I.V.:365; IV Piggyback:100] Out: 750 [Urine:750]  Lab Results:  Basename 07/12/11 0825 07/11/11 2319 07/11/11 2024  WBC 11.4* 12.1* 11.6*  HGB 15.2 16.8 17.4*  HCT 41.6 45.4 47.3  PLT 151 182 193   BMET  Basename 07/12/11 0825 07/11/11 2319 07/11/11 2024  NA 133* 129* 129*  K 3.9 3.7 4.3  CL 103 92* 92*  CO2 22 19 20   GLUCOSE 253* 351* 364*  BUN 11 14 15   CREATININE 0.66 0.67 0.67  CALCIUM 8.7 10.0 10.0   LFT  Basename 07/11/11 2319  PROT 7.4  ALBUMIN 3.7  AST 47*  ALT 69*  ALKPHOS 160*  BILITOT 0.4  BILIDIR <0.1  IBILI NOT CALCULATED     Studies/Results: Dg Abd Acute W/chest  07/12/2011  *RADIOLOGY REPORT*  Clinical Data: Abdominal pain  ACUTE ABDOMEN SERIES (ABDOMEN 2 VIEW & CHEST 1 VIEW)  Comparison: 06/10/2010  Findings: Low lung volumes.  Vascular congestion.  Upper normal heart size.  No free intraperitoneal gas.  No disproportionate dilatation of bowel.  Nonspecific small bowel and gastric air fluid levels are noted.  IMPRESSION: No evidence of free intraperitoneal gas.  Nonobstructive bowel gas pattern.  Low lung volumes.  Vascular congestion.  Original Report Authenticated By: Donavan Burnet, M.D.    IMPRESSION:  #67 56 year old male admitted with upper abdominal pain nausea and vomiting. Patient was found to have a glucose in excess of 350 and parameters consistent with mild DKA which is corrected. Certainly some of his symptoms are secondary to poorly controlled diabetes, and he may have an element of gastroparesis currently. With six-month history of  upper abnormal pain and weight loss, he will need further workup to rule out peptic ulcer disease, versus underlying malignancy. #2 elevated LFTs-rule out alcohol-induced hepatitis, rule out underlying hepatitis B or C. #3 history of peptic ulcer disease with bleed approximately 10 years ago #4 diabetes poorly controlled #5 hypertension #6 chronic EtOH use  PLAN: Clear liquid diet today Continue Protonix and anti-emetics Will schedule for upper endoscopy with Dr. Rhea Belton for tomorrow 07/12/2011. Procedure wasdiscussed with the patient and he is agreeable to proceed Check chronic hepatitis B and C. serology If the EGD is unrevealing he will need further workup for his complaints of pain, weight loss, abnormal LFTs and mild periportal adenopathy noted on CT scan in February 2013. MRI of the abdomen and pelvis may be helpful.   Rowan Pollman  07/12/2011, 12:09 PM

## 2011-07-12 NOTE — Progress Notes (Signed)
I have seen and assessed patient and agree with Dr Toniann Fail assessment and plan. Patient with epigastic tenderness, HX OF PUD with perforation in past per patient and history of alcohol abuse. CT abdomen and pelvis pending. Continue PPI. Consult with gastroenterology for possible EGD.

## 2011-07-12 NOTE — Consult Note (Signed)
Patient seen, examined, and I agree with the above documentation, including the assessment and plan. On PPI and antiemetics.  No vomiting today. Plan EGD in the am. Elevated transaminases, hep B and C serologies pending. Agree that MRI abd might be helped, but at least the 3 month f/u CT as recommended by radiology after last CT abd in Feb 2013 Also, uncontrolled DM is very likely contributing to his GI complaints.

## 2011-07-12 NOTE — ED Provider Notes (Signed)
Medical screening examination/treatment/procedure(s) were performed by non-physician practitioner and as supervising physician I was immediately available for consultation/collaboration.    Nelia Shi, MD 07/12/11 2209

## 2011-07-12 NOTE — ED Notes (Signed)
Epic downtime. Pt arrive c/o elevated bs related to unable to afford medications. Pt alert x3 resp easy non labored. Iv start. Md aware of pt

## 2011-07-12 NOTE — Progress Notes (Signed)
Report called from ED RN to 4700 RN at 06:45. Pt arrived to floor at 0700 during shift change without ordered Insulin drip for previously ordered Glucostabilizer. It is undetermined if previously ordered CT scan has been performed. Spoke with CT representative, who will be attempting to contact the admitting MD for clarification of the order and/or results. Pt stable. VS stable and documented. Oncoming nurse will be contacting MD to clarify between 3 active IV infusion orders. Will continue to monitor.

## 2011-07-12 NOTE — H&P (Signed)
Carl Cooke is an 56 y.o. male.   PCP - None. Chief Complaint: Abdominal pain and increased blood sugar. HPI: 56 year-old male with known history of diabetes mellitus type 2 and hypertension who has not been taking his medications for many months now as he has not been able to afford it came to the ER because of persistent abdominal pain her last 3-4 days and he thought his blood sugar also was running high. In the ER he was found to have a sugar over 350 with anion gap of 18 at this time patientis  admitted for abdominal pain with uncontrolled diabetes with early DKA. Patient still abdominal pain in epigastric area non-radiating constant and increasing with food. He did throw up couple of times but no blood in it. Denies any diarrhea. Denies any chest pain or shortness of breath.  Past Medical History  Diagnosis Date  . Diabetes mellitus   . Hypertension     History reviewed. No pertinent past surgical history.  Family History  Problem Relation Age of Onset  . Diabetes type II Brother    Social History:  reports that he has been smoking.  He does not have any smokeless tobacco history on file. He reports that he drinks alcohol. He reports that he does not use illicit drugs.  Allergies: No Known Allergies  Medications Prior to Admission  Medication Dose Route Frequency Provider Last Rate Last Dose  . metoCLOPramide (REGLAN) injection 10 mg  10 mg Intravenous Once Angus Seller, PA   10 mg at 07/12/11 0241  . morphine 4 MG/ML injection            No current outpatient prescriptions on file as of 07/11/2011.    Results for orders placed during the hospital encounter of 07/11/11 (from the past 48 hour(s))  CBC     Status: Abnormal   Collection Time   07/11/11 11:19 PM      Component Value Range Comment   WBC 12.1 (*) 4.0 - 10.5 (K/uL)    RBC 5.03  4.22 - 5.81 (MIL/uL)    Hemoglobin 16.8  13.0 - 17.0 (g/dL)    HCT 16.1  09.6 - 04.5 (%)    MCV 90.3  78.0 - 100.0 (fL)    MCH 33.4   26.0 - 34.0 (pg)    MCHC 37.0 (*) 30.0 - 36.0 (g/dL)    RDW 40.9  81.1 - 91.4 (%)    Platelets 182  150 - 400 (K/uL)   DIFFERENTIAL     Status: Abnormal   Collection Time   07/11/11 11:19 PM      Component Value Range Comment   Neutrophils Relative 55  43 - 77 (%)    Neutro Abs 6.7  1.7 - 7.7 (K/uL)    Lymphocytes Relative 34  12 - 46 (%)    Lymphs Abs 4.1 (*) 0.7 - 4.0 (K/uL)    Monocytes Relative 10  3 - 12 (%)    Monocytes Absolute 1.2 (*) 0.1 - 1.0 (K/uL)    Eosinophils Relative 1  0 - 5 (%)    Eosinophils Absolute 0.1  0.0 - 0.7 (K/uL)    Basophils Relative 0  0 - 1 (%)    Basophils Absolute 0.0  0.0 - 0.1 (K/uL)   BASIC METABOLIC PANEL     Status: Abnormal   Collection Time   07/11/11 11:19 PM      Component Value Range Comment   Sodium 129 (*) 135 -  145 (mEq/L)    Potassium 3.7  3.5 - 5.1 (mEq/L)    Chloride 92 (*) 96 - 112 (mEq/L)    CO2 19  19 - 32 (mEq/L)    Glucose, Bld 351 (*) 70 - 99 (mg/dL)    BUN 14  6 - 23 (mg/dL)    Creatinine, Ser 1.47  0.50 - 1.35 (mg/dL)    Calcium 82.9  8.4 - 10.5 (mg/dL)    GFR calc non Af Amer >90  >90 (mL/min)    GFR calc Af Amer >90  >90 (mL/min)   HEPATIC FUNCTION PANEL     Status: Abnormal   Collection Time   07/11/11 11:19 PM      Component Value Range Comment   Total Protein 7.4  6.0 - 8.3 (g/dL)    Albumin 3.7  3.5 - 5.2 (g/dL)    AST 47 (*) 0 - 37 (U/L)    ALT 69 (*) 0 - 53 (U/L)    Alkaline Phosphatase 160 (*) 39 - 117 (U/L)    Total Bilirubin 0.4  0.3 - 1.2 (mg/dL)    Bilirubin, Direct <5.6  0.0 - 0.3 (mg/dL)    Indirect Bilirubin NOT CALCULATED  0.3 - 0.9 (mg/dL)   LIPASE, BLOOD     Status: Normal   Collection Time   07/11/11 11:19 PM      Component Value Range Comment   Lipase 38  11 - 59 (U/L)   GLUCOSE, CAPILLARY     Status: Abnormal   Collection Time   07/12/11  1:11 AM      Component Value Range Comment   Glucose-Capillary 304 (*) 70 - 99 (mg/dL)   URINALYSIS, ROUTINE W REFLEX MICROSCOPIC     Status: Abnormal    Collection Time   07/12/11  2:38 AM      Component Value Range Comment   Color, Urine YELLOW  YELLOW     APPearance CLEAR  CLEAR     Specific Gravity, Urine 1.044 (*) 1.005 - 1.030     pH 5.5  5.0 - 8.0     Glucose, UA >1000 (*) NEGATIVE (mg/dL)    Hgb urine dipstick NEGATIVE  NEGATIVE     Bilirubin Urine NEGATIVE  NEGATIVE     Ketones, ur 15 (*) NEGATIVE (mg/dL)    Protein, ur NEGATIVE  NEGATIVE (mg/dL)    Urobilinogen, UA 0.2  0.0 - 1.0 (mg/dL)    Nitrite NEGATIVE  NEGATIVE     Leukocytes, UA NEGATIVE  NEGATIVE    URINE MICROSCOPIC-ADD ON     Status: Normal   Collection Time   07/12/11  2:38 AM      Component Value Range Comment   Squamous Epithelial / LPF RARE  RARE     WBC, UA 0-2  <3 (WBC/hpf)    Bacteria, UA RARE  RARE     Dg Abd Acute W/chest  07/12/2011  *RADIOLOGY REPORT*  Clinical Data: Abdominal pain  ACUTE ABDOMEN SERIES (ABDOMEN 2 VIEW & CHEST 1 VIEW)  Comparison: 06/10/2010  Findings: Low lung volumes.  Vascular congestion.  Upper normal heart size.  No free intraperitoneal gas.  No disproportionate dilatation of bowel.  Nonspecific small bowel and gastric air fluid levels are noted.  IMPRESSION: No evidence of free intraperitoneal gas.  Nonobstructive bowel gas pattern.  Low lung volumes.  Vascular congestion.  Original Report Authenticated By: Donavan Burnet, M.D.    Review of Systems  Constitutional: Negative.   HENT: Negative.   Eyes:  Negative.   Respiratory: Negative.   Cardiovascular: Negative.   Gastrointestinal: Positive for nausea and abdominal pain.  Genitourinary: Negative.   Musculoskeletal: Negative.   Skin: Negative.   Neurological: Negative.   Endo/Heme/Allergies: Negative.   Psychiatric/Behavioral: Negative.     Blood pressure 123/77, pulse 86, temperature 98.4 F (36.9 C), temperature source Oral, resp. rate 20, SpO2 93.00%. Physical Exam  Constitutional: He is oriented to person, place, and time. He appears well-developed and well-nourished.  No distress.  HENT:  Head: Normocephalic and atraumatic.  Right Ear: External ear normal.  Left Ear: External ear normal.  Nose: Nose normal.  Mouth/Throat: Oropharynx is clear and moist. No oropharyngeal exudate.  Eyes: Conjunctivae are normal. Pupils are equal, round, and reactive to light. Right eye exhibits no discharge. Left eye exhibits no discharge. No scleral icterus.  Neck: Normal range of motion. Neck supple.  Cardiovascular: Normal rate and regular rhythm.   Respiratory: Effort normal and breath sounds normal. No respiratory distress. He has no wheezes. He has no rales.  GI: Soft. Bowel sounds are normal.  Musculoskeletal: Normal range of motion. He exhibits no edema and no tenderness.  Neurological: He is alert and oriented to person, place, and time.       Moves all extremities.  Skin: Skin is warm and dry. No rash noted. He is not diaphoretic. No erythema.  Psychiatric: His behavior is normal.     Assessment/Plan #1. Uncontrolled diabetes mellitus2 with early DKA - this time the patient will be on IV insulin until his anion gap is corrected.And then change to long acting insulin. 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 Acute abdominal series is negative for anything acute. Place on Protonix. #3.Alcohol and tobacco abuse - Place patient on alcohol withdrawal protocol with PRN ativan. He drinks daily beer.Advised patient to quit smoking.  Code Status - Full code.  Allecia Bells N. 07/12/2011, 3:59 AM

## 2011-07-12 NOTE — ED Notes (Signed)
Return from xray

## 2011-07-13 ENCOUNTER — Encounter (HOSPITAL_COMMUNITY)
Admission: EM | Disposition: A | Discharge status: Home Health | Payer: Self-pay | Source: Home / Self Care | Attending: Internal Medicine

## 2011-07-13 ENCOUNTER — Encounter (HOSPITAL_COMMUNITY): Payer: Self-pay

## 2011-07-13 DIAGNOSIS — K2100 Gastro-esophageal reflux disease with esophagitis, without bleeding: Secondary | ICD-10-CM

## 2011-07-13 DIAGNOSIS — K30 Functional dyspepsia: Secondary | ICD-10-CM

## 2011-07-13 DIAGNOSIS — K2990 Gastroduodenitis, unspecified, without bleeding: Secondary | ICD-10-CM | POA: Diagnosis present

## 2011-07-13 HISTORY — DX: Gastro-esophageal reflux disease with esophagitis, without bleeding: K21.00

## 2011-07-13 HISTORY — PX: ESOPHAGOGASTRODUODENOSCOPY: SHX5428

## 2011-07-13 HISTORY — DX: Functional dyspepsia: K30

## 2011-07-13 LAB — GLUCOSE, CAPILLARY: Glucose-Capillary: 265 mg/dL — ABNORMAL HIGH (ref 70–99)

## 2011-07-13 LAB — HEPATITIS PANEL, ACUTE
HCV Ab: REACTIVE — AB
Hep B C IgM: NEGATIVE
Hepatitis B Surface Ag: NEGATIVE

## 2011-07-13 LAB — LIPID PANEL
Cholesterol: 205 mg/dL — ABNORMAL HIGH (ref 0–200)
VLDL: 65 mg/dL — ABNORMAL HIGH (ref 0–40)

## 2011-07-13 LAB — CBC
Hemoglobin: 15.6 g/dL (ref 13.0–17.0)
Platelets: 159 10*3/uL (ref 150–400)
RBC: 4.71 MIL/uL (ref 4.22–5.81)
WBC: 10.7 10*3/uL — ABNORMAL HIGH (ref 4.0–10.5)

## 2011-07-13 LAB — BASIC METABOLIC PANEL
CO2: 23 mEq/L (ref 19–32)
Calcium: 9.3 mg/dL (ref 8.4–10.5)
Creatinine, Ser: 0.75 mg/dL (ref 0.50–1.35)

## 2011-07-13 LAB — HEMOGLOBIN A1C
Hgb A1c MFr Bld: 11.1 % — ABNORMAL HIGH (ref <5.7)
Mean Plasma Glucose: 272 mg/dL — ABNORMAL HIGH (ref <117)

## 2011-07-13 LAB — LIPASE, BLOOD: Lipase: 20 U/L (ref 11–59)

## 2011-07-13 SURGERY — EGD (ESOPHAGOGASTRODUODENOSCOPY)
Anesthesia: Moderate Sedation

## 2011-07-13 MED ORDER — SIMVASTATIN 10 MG PO TABS: 10.0000 mg | ORAL_TABLET | Freq: Every day | ORAL | Status: DC

## 2011-07-13 MED ORDER — OXYCODONE HCL 5 MG PO TABS
5.0000 mg | ORAL_TABLET | ORAL | Status: DC | PRN
  Administered 2011-07-13: 5 mg via ORAL
  Filled 2011-07-13: qty 1

## 2011-07-13 MED ORDER — MIDAZOLAM HCL 10 MG/2ML IJ SOLN
INTRAMUSCULAR | Status: DC | PRN
  Administered 2011-07-13 (×2): 2 mg via INTRAVENOUS

## 2011-07-13 MED ORDER — PANTOPRAZOLE SODIUM 40 MG PO TBEC
40.0000 mg | DELAYED_RELEASE_TABLET | Freq: Two times a day (BID) | ORAL | Status: DC
  Administered 2011-07-13: 40 mg via ORAL
  Filled 2011-07-13: qty 1

## 2011-07-13 MED ORDER — PANTOPRAZOLE SODIUM 40 MG PO TBEC
40.0000 mg | DELAYED_RELEASE_TABLET | Freq: Two times a day (BID) | ORAL | Status: DC
  Administered 2011-07-13 – 2011-07-15 (×5): 40 mg via ORAL
  Filled 2011-07-13 (×4): qty 1

## 2011-07-13 MED ORDER — BUTAMBEN-TETRACAINE-BENZOCAINE 2-2-14 % EX AERO
INHALATION_SPRAY | CUTANEOUS | Status: DC | PRN
  Administered 2011-07-13: 2 via TOPICAL

## 2011-07-13 MED ORDER — DIPHENHYDRAMINE HCL 50 MG/ML IJ SOLN
INTRAMUSCULAR | Status: DC | PRN
  Administered 2011-07-13: 25 mg via INTRAVENOUS

## 2011-07-13 MED ORDER — MIDAZOLAM HCL 10 MG/2ML IJ SOLN
INTRAMUSCULAR | Status: AC
  Filled 2011-07-13: qty 2

## 2011-07-13 MED ORDER — DIPHENHYDRAMINE HCL 50 MG/ML IJ SOLN
INTRAMUSCULAR | Status: AC
  Filled 2011-07-13: qty 1

## 2011-07-13 MED ORDER — INSULIN ASPART PROT & ASPART (70-30 MIX) 100 UNIT/ML ~~LOC~~ SUSP
22.0000 [IU] | Freq: Two times a day (BID) | SUBCUTANEOUS | Status: DC
  Administered 2011-07-14 – 2011-07-15 (×3): 22 [IU] via SUBCUTANEOUS
  Filled 2011-07-13: qty 3

## 2011-07-13 MED ORDER — FENTANYL CITRATE 0.05 MG/ML IJ SOLN
INTRAMUSCULAR | Status: AC
  Filled 2011-07-13: qty 2

## 2011-07-13 MED ORDER — METOCLOPRAMIDE HCL 5 MG/ML IJ SOLN
10.0000 mg | Freq: Four times a day (QID) | INTRAMUSCULAR | Status: DC
  Administered 2011-07-13: 10 mg via INTRAVENOUS
  Filled 2011-07-13 (×7): qty 2

## 2011-07-13 MED ORDER — FENTANYL CITRATE 0.05 MG/ML IJ SOLN
INTRAMUSCULAR | Status: DC | PRN
  Administered 2011-07-13 (×2): 25 ug via INTRAVENOUS

## 2011-07-13 NOTE — Op Note (Signed)
[facility] [address] [place], [place]  [address]  ENDOSCOPY PROCEDURE REPORT  PATIENT:  Carl Cooke, Carl Cooke  MR#:  [id number] BIRTHDATE:  12/25/1955, 55 yrs. old  GENDER:  male ENDOSCOPIST:  Carie Caddy. Neamiah Sciarra, MD Referred by:  [facility], PROCEDURE DATE:  07/13/2011 PROCEDURE:  EGD with biopsy, 29562 ASA CLASS:  Class II INDICATIONS:  epigastric pain, nausea and vomiting MEDICATIONS:   Benadryl 25 mg IV, Fentanyl 50 mcg IV, Versed 4 mg IV TOPICAL ANESTHETIC:  Cetacaine Spray  DESCRIPTION OF PROCEDURE:   After the risks benefits and alternatives of the procedure were thoroughly explained, informed consent was obtained.  The Pentax Gastroscope [id number] endoscope was introduced through the mouth and advanced to the second portion of the duodenum, without limitations.  The instrument was slowly withdrawn as the mucosa was fully examined. <<PROCEDUREIMAGES>>  LA Grade B esophagitis was found in the distal esophagus.  There were columnar-type mucosal changes in the distal esophagus, that could represent Barrett's esophagus. There were 2 tongues of salmon colored mucosa extending from the Z-line which was at 40 cm (tongues extend 1 cm).  Then there was a 1 cm island of salmon-colored mucosa 1 cm above the z-line.  Multiple biopsies were obtained and sent to pathology.  Mild gastritis was found in the total stomach. Biopsies of the antrum and body of the stomach were obtained and sent to pathology.  A sessile polyp was found in the fundus. With standard forceps, a biopsy was obtained and sent to pathology.  Mild Duodenitis was found in the bulb of the duodenum.    Retroflexed views revealed findings as previously described.    The scope was then withdrawn from the patient and the procedure completed.  COMPLICATIONS:  None  ENDOSCOPIC IMPRESSION: 1) Esophagitis in the distal esophagus, likely reflux related. 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 Multiple biopsies performed. 3) Mild gastritis in the total stomach.  Multiple biopsies performed. 4) Sessile polyp in the fundus, likely benign. Biopsy performed.  5) Duodenitis in the bulb of duodenum.  RECOMMENDATIONS: 1) Await pathology results 2) Continue daily PPI for at least 2 months. 3) Follow-up of helicobacter pylori status, treat if indicated 4) Delayed gastric emptying, associated with poorly controlled blood sugars , is likely contributing to nausea and vomiting. 5) The need for repeat EGD will be based on pathology results.  Carie Caddy. Errin Whitelaw, MD  CC:  The Patient  n. eSIGNED:   Carie Caddy. Ester Hilley at 07/13/2011 09:50 AM  Vito Backers, [phone]

## 2011-07-13 NOTE — Progress Notes (Signed)
CBG = 212 this AM, but pt has been NPO since midnight for EGD this AM. Donnamarie Poag, NP made aware. NP instructed to hold [facility] sliding scale insulin due to procedure. Sliding scale insulin not given. Will continue to monitor.

## 2011-07-13 NOTE — Progress Notes (Signed)
Patient is being transported to Endoscopy for procedure, patient is stable_______________________D. Manson Passey RN

## 2011-07-13 NOTE — Progress Notes (Signed)
Subjective: Patient states abdominal pain improving. No nausea, no vomiting, patient tolerated diet okay.  Objective: Vital signs in last 24 hours: Filed Vitals:   07/13/11 1020 07/13/11 1226 07/13/11 1402 07/13/11 1557  BP: 155/86 154/92 140/76 138/88  Pulse:  66 74 70  Temp:  98.8 F (37.1 C) 98.2 F (36.8 C) 98.2 F (36.8 C)  TempSrc:  Oral Oral Oral  Resp: 17 18 20 18   Height:      Weight:      SpO2: 96% 96% 96% 95%    Intake/Output Summary (Last 24 hours) at 07/13/11 1604 Last data filed at 07/13/11 1519  Gross per 24 hour  Intake 2458.33 ml  Output   3625 ml  Net -1166.67 ml    Weight change:   General: Alert, awake, oriented x3, in no acute distress. Heart: Regular rate and rhythm, without murmurs, rubs, gallops. Lungs: Clear to auscultation bilaterally. Abdomen: Soft, minimal to mild diffuse tenderness to palpation, nondistended, positive bowel sounds. Extremities: No clubbing cyanosis or edema with positive pedal pulses.    Lab Results:  [facility] 07/13/11 0528 07/12/11 1250  NA 133* 131*  K 3.7 3.6  CL 102 100  CO2 23 20  GLUCOSE 223* 202*  BUN 6 9  CREATININE 0.75 0.63  CALCIUM 9.3 8.8  MG -- --  PHOS -- --    Basename 07/11/11 2319 07/11/11 2024  AST 47* 54*  ALT 69* 67*  ALKPHOS 160* 160*  BILITOT 0.4 0.4  PROT 7.4 7.6  ALBUMIN 3.7 3.9    Basename 07/13/11 0528 07/11/11 2319  LIPASE 20 38  AMYLASE -- --    Basename 07/13/11 0528 07/12/11 0825 07/11/11 2319 07/11/11 2024  WBC 10.7* 11.4* -- --  NEUTROABS -- -- 6.7 6.7  HGB 15.6 15.2 -- --  HCT 42.8 41.6 -- --  MCV 90.9 90.8 -- --  PLT 159 151 -- --    Basename 07/12/11 0828  CKTOTAL 47  CKMB 1.7  CKMBINDEX --  TROPONINI <0.30   No components found with this basename: POCBNP:3 No results found for this basename: DDIMER:2 in the last 72 hours  Basename 07/13/11 0528  HGBA1C 11.1*    Basename 07/13/11 0528  CHOL 205*  HDL 33*  LDLCALC 107*  TRIG 326*  CHOLHDL 6.2    LDLDIRECT --   No results found for this basename: TSH,T4TOTAL,FREET3,T3FREE,THYROIDAB in the last 72 hours No results found for this basename: VITAMINB12:2,FOLATE:2,FERRITIN:2,TIBC:2,IRON:2,RETICCTPCT:2 in the last 72 hours  Micro Results: No results found for this or any previous visit (from the past 240 hour(s)).  Studies/Results: Ct Abdomen Pelvis W Contrast  07/12/2011  *RADIOLOGY REPORT*  Clinical Data: Lower abdominal pain.  Nausea and vomiting.  CT ABDOMEN AND PELVIS WITH CONTRAST  Technique:  Multidetector CT imaging of the abdomen and pelvis was performed following the standard protocol during bolus administration of intravenous contrast.  Contrast: OMNIPAQUE IOHEXOL 300 MG/ML IJ SOLN  Comparison: 06/11/2010  Findings: Severe hepatic steatosis and focal sparing adjacent to the porta hepatis appears stable.  No liver masses are identified. Gallbladder is unremarkable, and there is no evidence of biliary ductal dilatation.  The pancreas, spleen, adrenal glands, and left kidney remain normal appearance.  5.5 cm right renal cyst remains stable and there is no evidence of renal mass or hydronephrosis.  No soft tissue masses or lymphadenopathy identified elsewhere within the abdomen or pelvis. Shotty lymph nodes in the porta hepatis and portacaval space remains stable and are not pathologically enlarged.  No evidence of inflammatory process or abnormal fluid collections. No evidence of bowel wall thickening or dilatation.  No hernia identified.  IMPRESSION:  1.  No acute findings. 2.  Stable severe hepatic steatosis.  Original Report Authenticated By: Danae Orleans, M.D.   Dg Abd Acute W/chest  07/12/2011  *RADIOLOGY REPORT*  Clinical Data: Abdominal pain  ACUTE ABDOMEN SERIES (ABDOMEN 2 VIEW & CHEST 1 VIEW)  Comparison: 06/10/2010  Findings: Low lung volumes.  Vascular congestion.  Upper normal heart size.  No free intraperitoneal gas.  No disproportionate dilatation of bowel.  Nonspecific  small bowel and gastric air fluid levels are noted.  IMPRESSION: No evidence of free intraperitoneal gas.  Nonobstructive bowel gas pattern.  Low lung volumes.  Vascular congestion.  Original Report Authenticated By: Donavan Burnet, M.D.    Medications:     . folic acid  1 mg Oral Daily  . insulin aspart  0-15 Units Subcutaneous TID WC  . insulin aspart protamine-insulin aspart  22 Units Subcutaneous BID WC  . insulin glargine  40 Units Subcutaneous QHS  . metoCLOPramide (REGLAN) injection  10 mg Intravenous Q6H  . mulitivitamin with minerals  1 tablet Oral Daily  . pantoprazole  40 mg Oral BID AC  . thiamine  100 mg Oral Daily   Or  . thiamine  100 mg Intravenous Daily  . DISCONTD: pantoprazole (PROTONIX) IV  40 mg Intravenous Q12H  . DISCONTD: simvastatin  10 mg Oral q1800    Assessment: Principal Problem:  *DKA, type 2 Active Problems:  Abdominal pain  Alcoholism  Nausea and vomiting  Elevated LFTs  Reflux esophagitis  Gastritis and duodenitis  Delayed gastric emptying   Plan: #1 poorly/uncontrolled type 2 diabetes with early DKA Questionable etiology. Abdominal x-ray with no acute findings. CT of the abdomen and pelvis with severe hepatic steatosis. Anion gap is closed. Hemoglobin A1c is 11.1. Patient has been transitioned to subcutaneous insulin. Patient currently on the carb modified diet. We'll decrease IV fluid rate. We'll transition patient from Lantus to NPH 70/30 as finances are an issue. Continue sliding scale insulin. Diabetes education.  #2 abdominal pain Likely secondary to reflux esophagitis, gastritis and duodenitis seen on upper EGD. Biopsies are pending. CT of the abdomen and pelvis shows severe hepatic steatosis. Continue PPI. Continue supportive care pain management. GI following and appreciate input and recommendations  #3 nausea vomiting Likely secondary to problem #2 versus gastroparesis. Per upper EGD the patient with delayed gastric emptying.  Clinical improvement. Will place patient on Reglan 10 mg IV every 6 hours. Antiemetics. Supportive care.  #4 reflux esophagitis Per upper EGD. PPI.  #5 elevated LFTs Questionable etiology. CT of the abdomen and pelvis with severe hepatic steatosis. Acute hepatitis panel is pending. Will hold off on starting patient on a statin secondary to elevated LFTs. Monitor follow.  #6 alcohol abuse Continue CIWA protocol.  #7 HTN Stable.   #8 prophylaxis PPI for GI prophylaxis, SCDs for DVT prophylaxis.   LOS: 2 days   [facility] 07/13/2011, 4:04 PM

## 2011-07-13 NOTE — Progress Notes (Signed)
Patient has returned to unit, report received from Endo; patient is stable__________________________D. Manson Passey RN

## 2011-07-13 NOTE — Progress Notes (Signed)
Referral received.  Talked to Dr. Janee Morn.  Patient was in endo.  Patient had been on insulin in the past but was unable to afford.  He had been using wife's insulin according to MD.  May consider more affordable insulin regimen such as Reli-on 70/30 bid.  Will see patient 07/14/11.  Will also request case management referral.

## 2011-07-14 ENCOUNTER — Encounter (HOSPITAL_COMMUNITY): Payer: Self-pay | Admitting: Internal Medicine

## 2011-07-14 LAB — COMPREHENSIVE METABOLIC PANEL
BUN: 7 mg/dL (ref 6–23)
CO2: 23 mEq/L (ref 19–32)
Calcium: 9.5 mg/dL (ref 8.4–10.5)
Chloride: 99 mEq/L (ref 96–112)
Creatinine, Ser: 0.77 mg/dL (ref 0.50–1.35)
GFR calc Af Amer: >90 mL/min (ref >90)
GFR calc non Af Amer: >90 mL/min (ref >90)
Total Bilirubin: 0.5 mg/dL (ref 0.3–1.2)

## 2011-07-14 LAB — CBC
Hemoglobin: 15.2 g/dL (ref 13.0–17.0)
MCH: 32.5 pg (ref 26.0–34.0)
Platelets: 154 10*3/uL (ref 150–400)
RBC: 4.67 MIL/uL (ref 4.22–5.81)
WBC: 8.5 10*3/uL (ref 4.0–10.5)

## 2011-07-14 LAB — GLUCOSE, CAPILLARY
Glucose-Capillary: 274 mg/dL — ABNORMAL HIGH (ref 70–99)
Glucose-Capillary: 215 mg/dL — ABNORMAL HIGH (ref 70–99)

## 2011-07-14 MED ORDER — METOCLOPRAMIDE HCL 10 MG PO TABS
10.0000 mg | ORAL_TABLET | Freq: Three times a day (TID) | ORAL | Status: DC
  Administered 2011-07-14 – 2011-07-16 (×6): 10 mg via ORAL
  Filled 2011-07-14 (×10): qty 1

## 2011-07-14 MED ORDER — POTASSIUM CHLORIDE CRYS ER 20 MEQ PO TBCR
40.0000 meq | EXTENDED_RELEASE_TABLET | Freq: Once | ORAL | Status: AC
  Administered 2011-07-14: 40 meq via ORAL
  Filled 2011-07-14: qty 2

## 2011-07-14 MED ORDER — ALBUTEROL SULFATE (5 MG/ML) 0.5% IN NEBU
2.5000 mg | INHALATION_SOLUTION | Freq: Four times a day (QID) | RESPIRATORY_TRACT | Status: DC | PRN
  Administered 2011-07-14 (×2): 2.5 mg via RESPIRATORY_TRACT
  Filled 2011-07-14 (×2): qty 0.5

## 2011-07-14 MED ORDER — FUROSEMIDE 10 MG/ML IJ SOLN
60.0000 mg | Freq: Once | INTRAMUSCULAR | Status: AC
  Administered 2011-07-14: 60 mg via INTRAVENOUS
  Filled 2011-07-14: qty 6

## 2011-07-14 MED ORDER — IPRATROPIUM BROMIDE 0.02 % IN SOLN
0.5000 mg | Freq: Four times a day (QID) | RESPIRATORY_TRACT | Status: DC | PRN
  Administered 2011-07-14 (×2): 0.5 mg via RESPIRATORY_TRACT
  Filled 2011-07-14 (×2): qty 2.5

## 2011-07-14 NOTE — Progress Notes (Signed)
07/13/11 Spoke with patient about PCP. [facility] not currently taking new patents, gave patient information on [facility]. Gave patient pharmacy discount card and checked with pharmacy that patient is eligible for indigent fund. CM will continue to follow. Jacquelynn Cree RN, BSN, CCM

## 2011-07-14 NOTE — Progress Notes (Signed)
Patient stated he has COPD and get shortness of breath atimes and uses inhaler at home. Patient's oxygen saturation was 99%. Patient was not in any respiratory distress. Notified doctor on-call (Dr. Cammy Copa), who gave me new orders to place in and I readback telephone order to doctor. Contacted respiratory and informed them of new order placed in. Will continue to monitor patient.

## 2011-07-14 NOTE — Progress Notes (Signed)
Met with pt/wife and dicussed various issues.  Pt is disability and Medicaid pending.  RN CM seeing him for medication assistance and getting a PCP.  CSW asked to see pt for ETOH and transportation issues.  Pt denies SA issues and was not interested in resources.  Pt has a car and access to the bus lines.  Pt may consider [facility] in the future once his disability comes through.  Pt/wife get food stamps and she is on disability.  No other CSW needs identified.  Emotional support offered to pt.

## 2011-07-14 NOTE — Evaluation (Signed)
Physical Therapy Evaluation Patient Details Name: Carl Cooke MRN: [id number] DOB: [birth year] Today's Date: 07/14/2011  Problem List:  Patient Active Problem List  Diagnoses  . DKA, type 2  . Abdominal pain  . Alcoholism  . Nausea and vomiting  . Elevated LFTs  . Reflux esophagitis  . Gastritis and duodenitis  . Delayed gastric emptying    Past Medical History:  Past Medical History  Diagnosis Date  . Diabetes mellitus   . Hypertension   . Shortness of breath   . COPD (chronic obstructive pulmonary disease)     per MD told him he has COPD  . GERD (gastroesophageal reflux disease)   . Headache   . Reflux esophagitis 07/13/2011  . Gastritis and duodenitis 07/13/2011  . Delayed gastric emptying 07/13/2011   Past Surgical History:  Past Surgical History  Procedure Date  . Mandible fracture surgery   . Laparoscopic gastrotomy w/ repair of ulcer   . Esophagogastroduodenoscopy 07/13/2011    Procedure: ESOPHAGOGASTRODUODENOSCOPY (EGD);  Surgeon: Beverley Fiedler, MD;  Location: [facility] ENDOSCOPY;  Service: Gastroenterology;  Laterality: N/A;    PT Assessment/Plan/Recommendation PT Assessment Clinical Impression Statement: pt admitted with adominal pain determined to be due at least in part to gastritis, duodenitis, etc.  pt found to be in early DKA.  Pt's mobilitty is being hindered by newly progressive peripheral neuropathy and general weakness.  Pt can benefit from HHPT home safety eval and treat after D/C. PT Recommendation/Assessment: Patient will need skilled PT in the acute care venue PT Problem List: Decreased strength;Decreased activity tolerance;Decreased balance;Decreased knowledge of use of DME;Decreased mobility;Decreased coordination;Pain Barriers to Discharge: Decreased caregiver support PT Therapy Diagnosis : Difficulty walking;Generalized weakness PT Plan PT Frequency: Min 3X/week PT Treatment/Interventions: Gait training;Functional mobility training;Therapeutic  activities;Balance training;Patient/family education PT Recommendation Follow Up Recommendations: Home health PT Equipment Recommended: None recommended by OT;Defer to next venue for PT PT Goals  Acute Rehab PT Goals PT Goal Formulation: With patient Time For Goal Achievement: 7 days Pt will go Supine/Side to Sit: Independently PT Goal: Supine/Side to Sit - Progress: Goal set today Pt will go Sit to Stand: with modified independence PT Goal: Sit to Stand - Progress: Goal set today Pt will Transfer Bed to Chair/Chair to Bed: Independently PT Transfer Goal: Bed to Chair/Chair to Bed - Progress: Goal set today Pt will Ambulate: >150 feet;with modified independence;with least restrictive assistive device PT Goal: Ambulate - Progress: Goal set today  PT Evaluation Precautions/Restrictions  Precautions Precautions: Fall Precaution Comments: Pt. reports legs just give out sometimes Restrictions Weight Bearing Restrictions: No Prior Functioning  Home Living Lives With: Spouse Receives Help From: Family Type of Home: Apartment Home Layout: One level Home Access: Level entry Bathroom Shower/Tub: Tub/shower unit;Curtain Firefighter: Standard Bathroom Accessibility: Yes How Accessible: Accessible via walker Home Adaptive Equipment: Straight cane;Walker - rolling Additional Comments: Pt.'s daughter lives in the apt. next to pt. and pt's daughter has two children who pt. has to babysit intermittently. Pt's daughter works and is able to check on pt. and provide assist when able. Prior Function Level of Independence: Independent with basic ADLs;Independent with transfers;Independent with gait Able to Take Stairs?: Yes Driving: No Vocation: Unemployed Cognition Cognition Arousal/Alertness: Awake/alert Overall Cognitive Status: Appears within functional limits for tasks assessed Orientation Level: Oriented X4 Sensation/Coordination Sensation Light Touch: Impaired by gross  assessment Additional Comments: Pt. with numbness and tingling in fingertips and feet "pins and needles" and educated pt. on checking feet/hands to assess skin integrity due  to decreased sensation. Pt. verbalized understanding (Reinforced same issues discussed by the OT) Coordination Gross Motor Movements are Fluid and Coordinated: Yes Extremity Assessment RUE Assessment RUE Assessment: Within Functional Limits LUE Assessment LUE Assessment: Within Functional Limits RLE Assessment RLE Assessment: Within Functional Limits LLE Assessment LLE Assessment: Within Functional Limits (grossly 4/5 bil) Mobility (including Balance) Bed Mobility Bed Mobility: Yes Transfers Transfers: Yes Sit to Stand: 5: Supervision;With upper extremity assist;From chair/3-in-1 Sit to Stand Details (indicate cue type and reason): pt was appropriately safe using hands to assist safely Stand to Sit: 5: Supervision Ambulation/Gait Ambulation/Gait: Yes Ambulation/Gait Assistance: 5: Supervision Ambulation/Gait Assistance Details (indicate cue type and reason): antalgic in nature due to "pins and needles"; tentative and guarded Ambulation Distance (Feet): 75 Feet Assistive device: None Gait Pattern: Decreased step length - right;Decreased step length - left;Decreased stride length;Antalgic Stairs: No  Posture/Postural Control Posture/Postural Control: No significant limitations Balance Balance Assessed: No Exercise    End of Session PT - End of Session Activity Tolerance: Patient tolerated treatment well Patient left: in chair;Other (comment) (in another family members room) Nurse Communication: Mobility status for ambulation;Mobility status for transfers General Behavior During Session: [facility] for tasks performed Cognition: [facility] for tasks performed  Robin Pafford, Eliseo Gum 07/14/2011, 5:05 PM  07/14/2011  Kentland Bing, PT [phone] (pager)

## 2011-07-14 NOTE — Evaluation (Signed)
Occupational Therapy Evaluation Patient Details Name: Carl Cooke MRN: [id number] DOB: March 02, 1956 Today's Date: 07/14/2011  Problem List:  Patient Active Problem List  Diagnoses  . DKA, type 2  . Abdominal pain  . Alcoholism  . Nausea and vomiting  . Elevated LFTs  . Reflux esophagitis  . Gastritis and duodenitis  . Delayed gastric emptying    Past Medical History:  Past Medical History  Diagnosis Date  . Diabetes mellitus   . Hypertension   . Shortness of breath   . COPD (chronic obstructive pulmonary disease)     per MD told him he has COPD  . GERD (gastroesophageal reflux disease)   . Headache   . Reflux esophagitis 07/13/2011  . Gastritis and duodenitis 07/13/2011  . Delayed gastric emptying 07/13/2011   Past Surgical History:  Past Surgical History  Procedure Date  . Mandible fracture surgery   . Laparoscopic gastrotomy w/ repair of ulcer   . Esophagogastroduodenoscopy 07/13/2011    Procedure: ESOPHAGOGASTRODUODENOSCOPY (EGD);  Surgeon: Beverley Fiedler, MD;  Location: [facility] ENDOSCOPY;  Service: Gastroenterology;  Laterality: N/A;    OT Assessment/Plan/Recommendation OT Assessment Clinical Impression Statement: Pt. presents with poorly/uncontrolled type 2 diabetes with early DKA and abdominal pain s/p EGD and below problem list. Pt. will benefit from skilled OT to increase functional independence with ADLs to mod I level at D/C home. 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 OT Therapy Diagnosis : Generalized weakness;Acute pain OT Plan OT Frequency: Min 2X/week OT Treatment/Interventions: Self-care/ADL training;DME and/or AE  instruction;Energy conservation;Therapeutic activities;Patient/family education;Balance training OT Recommendation Follow Up Recommendations: Supervision - Intermittent; Equipment Recommended: None recommended by OT Individuals Consulted Consulted and Agree with Results and Recommendations: Patient OT Goals Acute Rehab OT Goals OT Goal Formulation: With patient Time For Goal Achievement: 7 days ADL Goals Pt Will Perform Grooming: with modified independence;Standing at sink ADL Goal: Grooming - Progress: Goal set today Pt Will Perform Lower Body Bathing: with modified independence;Sit to stand from bed ADL Goal: Lower Body Bathing - Progress: Goal set today Pt Will Perform Lower Body Dressing: with modified independence;Sit to stand from bed ADL Goal: Lower Body Dressing - Progress: Goal set today Pt Will Transfer to Toilet: with modified independence;3-in-1;with DME;Ambulation ADL Goal: Toilet Transfer - Progress: Goal set today  OT Evaluation Precautions/Restrictions  Precautions Precautions: Fall Precaution Comments: Pt. reports legs just give out sometimes Restrictions Weight Bearing Restrictions: No Prior Functioning Home Living Lives With: Spouse Receives Help From: Family Type of Home: Apartment Home Layout: One level Home Access: Level entry Bathroom Shower/Tub: Tub/shower unit;Curtain Firefighter: Standard Bathroom Accessibility: Yes How Accessible: Accessible via walker Home Adaptive Equipment: Straight cane;Walker - rolling Additional Comments: Pt.'s daughter lives in the apt. next to pt. and pt's daughter has two children who pt. has to babysit intermittently. Pt's daughter works and is able to check on pt. and provide assist when able. 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 Pt.'s wife fatigues very easily and is only able to ambulate short distances and needs RW to do  so ADL ADL Eating/Feeding: Performed;Independent Where Assessed - Eating/Feeding: Chair Grooming: Wash/dry hands;Performed;Set up;Minimal assistance Grooming Details (indicate cue type and reason): Min assist for balance due to narrow base of support bil feet Where Assessed - Grooming: Standing at sink Upper Body Bathing: Simulated;Chest;Right arm;Left arm;Abdomen;Set up Where Assessed - Upper Body Bathing: Sitting, chair Lower Body Bathing: Simulated;Moderate assistance Where Assessed - Lower Body Bathing: Sit to stand from chair Upper Body Dressing: Performed;Set up Upper Body Dressing Details (indicate cue type and reason): with donning gown Where Assessed - Upper Body Dressing: Sitting, chair Lower Body Dressing: Simulated;Minimal assistance Lower Body Dressing Details (indicate cue type and reason): pt. able to cross foot over opposite knee to don/doff socks Where Assessed - Lower Body Dressing: Sitting, chair Toilet Transfer: Performed;Minimal assistance Toilet Transfer Details (indicate cue type and reason): min verbal cues for hand placement and safety with RW Toilet Transfer Method: Ambulating Toilet Transfer Equipment: Regular height toilet;Grab bars Toileting - Clothing Manipulation: Performed;Modified independent Where Assessed - Toileting Clothing Manipulation: Sit on 3-in-1 or toilet Toileting - Hygiene: Performed;Independent Where Assessed - Toileting Hygiene: Sit on 3-in-1 or toilet Tub/Shower Transfer: Not assessed Tub/Shower Transfer Method: Not assessed Equipment Used: Rolling walker Ambulation Related to ADLs: Pt. min assist for balance ~12' with RW ADL Comments: Pt. educated on safety with RW to increase stability and improve balance due to pt. with decreased activity tolerance and decreased balance.  Mod verbal cues provided to increase base of support due to narrow step. Pt. educated on techniques for completing ADLs and energy conservation strategies. Sensation/Coordination Sensation Light Touch: Impaired by gross assessment Additional Comments: Pt. with numbness and tingling in fingertips and feet "pins and needles" and educated pt. on checking feet/hands to assess skin integrity due to decreased sensation. Pt. verbalized understanding Extremity Assessment RUE Assessment RUE Assessment: Within Functional Limits LUE Assessment LUE Assessment: Within Functional Limits Mobility  Bed Mobility Bed Mobility: Yes Supine to Sit: 6: Modified independent (Device/Increase time);With rails Transfers Transfers: Yes Sit to Stand: 5: Supervision;With upper extremity assist;From bed Sit to Stand Details (indicate cue type and reason): MIn verbal cues for safe hand placement on RW and from bed    End of Session OT - End of Session Equipment Utilized During Treatment: Gait belt Activity Tolerance: Patient tolerated treatment well Patient left: in chair;with call bell in reach Nurse Communication: Mobility status for transfers General Behavior During Session: [facility]  for tasks performed Cognition: [facility] for tasks performed   Liliane Mallis, OTR/L Pager [phone] 07/14/2011, 2:18 PM

## 2011-07-14 NOTE — Progress Notes (Signed)
Subjective: Patient states abdominal pain improving. No nausea, no vomiting, patient tolerated diet.  Objective: Vital signs in last 24 hours: Filed Vitals:   07/13/11 2108 07/14/11 0523 07/14/11 0624 07/14/11 1336  BP: 138/79 145/92  130/74  Pulse: 73 73  68  Temp: 97.8 F (36.6 C) 98.5 F (36.9 C)  97.9 F (36.6 C)  TempSrc: Oral Oral  Oral  Resp: 20 20  20   Height:      Weight:  87 kg (191 lb 12.8 oz)    SpO2: 95% 97% 98% 96%    Intake/Output Summary (Last 24 hours) at 07/14/11 2102 Last data filed at 07/14/11 2029  Gross per 24 hour  Intake 2102.5 ml  Output   3250 ml  Net -1147.5 ml    Weight change: -1.452 kg (-3 lb 3.2 oz)  General: Alert, awake, oriented x3, in no acute distress. Heart: Regular rate and rhythm, without murmurs, rubs, gallops. Lungs: Clear to auscultation bilaterally. Abdomen: Soft, minimal to mild diffuse tenderness to palpation, nondistended, positive bowel sounds. Extremities: No clubbing cyanosis or edema with positive pedal pulses.    Lab Results:  [facility] 07/14/11 0545 07/13/11 0528  NA 133* 133*  K 3.4* 3.7  CL 99 102  CO2 23 23  GLUCOSE 243* 223*  BUN 7 6  CREATININE 0.77 0.75  CALCIUM 9.5 9.3  MG -- --  PHOS -- --    Basename 07/14/11 0545 07/11/11 2319  AST 31 47*  ALT 50 69*  ALKPHOS 122* 160*  BILITOT 0.5 0.4  PROT 6.8 7.4  ALBUMIN 3.2* 3.7    Basename 07/13/11 0528 07/11/11 2319  LIPASE 20 38  AMYLASE -- --    Basename 07/14/11 0545 07/13/11 0528 07/11/11 2319  WBC 8.5 10.7* --  NEUTROABS -- -- 6.7  HGB 15.2 15.6 --  HCT 42.8 42.8 --  MCV 91.6 90.9 --  PLT 154 159 --    Basename 07/12/11 0828  CKTOTAL 47  CKMB 1.7  CKMBINDEX --  TROPONINI <0.30   No components found with this basename: POCBNP:3 No results found for this basename: DDIMER:2 in the last 72 hours  Basename 07/13/11 0528  HGBA1C 11.1*    Basename 07/13/11 0528  CHOL 205*  HDL 33*  LDLCALC 107*  TRIG 326*  CHOLHDL 6.2    LDLDIRECT --   No results found for this basename: TSH,T4TOTAL,FREET3,T3FREE,THYROIDAB in the last 72 hours No results found for this basename: VITAMINB12:2,FOLATE:2,FERRITIN:2,TIBC:2,IRON:2,RETICCTPCT:2 in the last 72 hours  Micro Results: No results found for this or any previous visit (from the past 240 hour(s)).  Studies/Results: No results found.  Medications:     . folic acid  1 mg Oral Daily  . furosemide  60 mg Intravenous Once  . insulin aspart  0-15 Units Subcutaneous TID WC  . insulin aspart protamine-insulin aspart  22 Units Subcutaneous BID WC  . insulin glargine  40 Units Subcutaneous QHS  . metoCLOPramide  10 mg Oral TID AC & HS  . mulitivitamin with minerals  1 tablet Oral Daily  . pantoprazole  40 mg Oral BID  . potassium chloride  40 mEq Oral Once  . thiamine  100 mg Oral Daily   Or  . thiamine  100 mg Intravenous Daily  . DISCONTD: metoCLOPramide (REGLAN) injection  10 mg Intravenous Q6H  . DISCONTD: pantoprazole  40 mg Oral BID AC    Assessment: Principal Problem:  *DKA, type 2 Active Problems:  Abdominal pain  Alcoholism  Nausea and vomiting  Elevated LFTs  Reflux esophagitis  Gastritis and duodenitis  Delayed gastric emptying   Plan: #1 poorly/uncontrolled type 2 diabetes with early DKA Questionable etiology. Abdominal x-ray with no acute findings. CT of the abdomen and pelvis with severe hepatic steatosis. Anion gap is closed. Hemoglobin A1c is 11.1. Patient has been transitioned to subcutaneous insulin. Patient currently on the carb modified diet. NSL IVF. Continue  NPH 70/30 . Continue sliding scale insulin. Diabetes education.  #2 abdominal pain Likely secondary to reflux esophagitis, gastritis and duodenitis seen on upper EGD. Biopsies are pending. CT of the abdomen and pelvis shows severe hepatic steatosis. Continue PPI. Continue supportive care pain management. GI following and appreciate input and recommendations  #3 nausea  vomiting Likely secondary to problem #2 versus gastroparesis. Per upper EGD the patient with delayed gastric emptying. Clinical improvement. Continue Reglan.  Antiemetics. Supportive care.  #4 reflux esophagitis Per upper EGD. PPI.  #5 elevated LFTs Questionable etiology. CT of the abdomen and pelvis with severe hepatic steatosis. Acute hepatitis panel is pending. Will hold off on starting patient on a statin secondary to elevated LFTs. Monitor follow.  #6 alcohol abuse Continue CIWA protocol.  #7 HTN Stable.   #8 prophylaxis PPI for GI prophylaxis, SCDs for DVT prophylaxis.   LOS: 3 days   Cristianna Cyr 07/14/2011, 9:02 PM

## 2011-07-14 NOTE — Progress Notes (Signed)
Spoke to patient regarding insulin/diabetes.  Patient states he has not taken insulin for the past 7 months due to cost.  He is is the process of trying to get disability.  Dr. Janee Morn has started 70/30 today.  Explained mech. of action for 70/30 and the importance of taking with meals.  He states he has lost 25 lbs recently.  Patient requests DVD to learn more about diabetes.  Will take to him.  Also gave him information about going to [employer] to get insulin and Reli-[employer] meter which is much more affordable.   Needs PCP.  Case management consult requested.

## 2011-07-15 LAB — BASIC METABOLIC PANEL
CO2: 25 mEq/L (ref 19–32)
Calcium: 9.7 mg/dL (ref 8.4–10.5)
Creatinine, Ser: 0.81 mg/dL (ref 0.50–1.35)
Glucose, Bld: 255 mg/dL — ABNORMAL HIGH (ref 70–99)

## 2011-07-15 LAB — CBC
MCH: 33.6 pg (ref 26.0–34.0)
MCV: 91.5 fL (ref 78.0–100.0)
Platelets: 164 10*3/uL (ref 150–400)
RDW: 12 % (ref 11.5–15.5)

## 2011-07-15 LAB — GLUCOSE, CAPILLARY
Glucose-Capillary: 279 mg/dL — ABNORMAL HIGH (ref 70–99)
Glucose-Capillary: 362 mg/dL — ABNORMAL HIGH (ref 70–99)

## 2011-07-15 MED ORDER — POTASSIUM CHLORIDE CRYS ER 20 MEQ PO TBCR
40.0000 meq | EXTENDED_RELEASE_TABLET | Freq: Once | ORAL | Status: AC
  Administered 2011-07-15: 40 meq via ORAL
  Filled 2011-07-15: qty 2

## 2011-07-15 MED ORDER — INSULIN ASPART PROT & ASPART (70-30 MIX) 100 UNIT/ML ~~LOC~~ SUSP
25.0000 [IU] | Freq: Two times a day (BID) | SUBCUTANEOUS | Status: DC
  Administered 2011-07-15 – 2011-07-16 (×2): 25 [IU] via SUBCUTANEOUS
  Filled 2011-07-15: qty 3

## 2011-07-15 NOTE — Progress Notes (Signed)
Subjective: Patient states abdominal pain improving. No nausea, no vomiting, patient tolerated diet. No complaints.  Objective: Vital signs in last 24 hours: Filed Vitals:   07/14/11 1336 07/14/11 2122 07/14/11 2125 07/15/11 0450  BP: 130/74 135/82  123/71  Pulse: 68 77  72  Temp: 97.9 F (36.6 C) 98.2 F (36.8 C)  97.8 F (36.6 C)  TempSrc: Oral Oral  Oral  Resp: 20 18  18   Height:      Weight:    86.6 kg (190 lb 14.7 oz)  SpO2: 96% 96% 96% 96%    Intake/Output Summary (Last 24 hours) at 07/15/11 1324 Last data filed at 07/15/11 0845  Gross per 24 hour  Intake   1480 ml  Output   2025 ml  Net   -545 ml    Weight change: -0.4 kg (-14.1 oz)  General: Alert, awake, oriented x3, in no acute distress. Heart: Regular rate and rhythm, without murmurs, rubs, gallops. Lungs: Clear to auscultation bilaterally. Abdomen: Soft, minimal  diffuse tenderness to palpation, nondistended, positive bowel sounds. Extremities: No clubbing cyanosis or edema with positive pedal pulses.    Lab Results:  Basename 07/15/11 0500 07/14/11 0545  NA 133* 133*  K 3.4* 3.4*  CL 97 99  CO2 25 23  GLUCOSE 255* 243*  BUN 10 7  CREATININE 0.81 0.77  CALCIUM 9.7 9.5  MG -- --  PHOS -- --    Basename 07/14/11 0545  AST 31  ALT 50  ALKPHOS 122*  BILITOT 0.5  PROT 6.8  ALBUMIN 3.2*    Basename 07/13/11 0528  LIPASE 20  AMYLASE --    Basename 07/15/11 0500 07/14/11 0545  WBC 9.2 8.5  NEUTROABS -- --  HGB 16.2 15.2  HCT 44.1 42.8  MCV 91.5 91.6  PLT 164 154   No results found for this basename: CKTOTAL:3,CKMB:3,CKMBINDEX:3,TROPONINI:3 in the last 72 hours No components found with this basename: POCBNP:3 No results found for this basename: DDIMER:2 in the last 72 hours  Basename 07/13/11 0528  HGBA1C 11.1*    Basename 07/13/11 0528  CHOL 205*  HDL 33*  LDLCALC 107*  TRIG 326*  CHOLHDL 6.2  LDLDIRECT --   No results found for this basename:  TSH,T4TOTAL,FREET3,T3FREE,THYROIDAB in the last 72 hours No results found for this basename: VITAMINB12:2,FOLATE:2,FERRITIN:2,TIBC:2,IRON:2,RETICCTPCT:2 in the last 72 hours  Micro Results: No results found for this or any previous visit (from the past 240 hour(s)).  Studies/Results: No results found.  Medications:     . folic acid  1 mg Oral Daily  . insulin aspart  0-15 Units Subcutaneous TID WC  . insulin aspart protamine-insulin aspart  25 Units Subcutaneous BID WC  . metoCLOPramide  10 mg Oral TID AC & HS  . mulitivitamin with minerals  1 tablet Oral Daily  . pantoprazole  40 mg Oral BID  . potassium chloride  40 mEq Oral Once  . thiamine  100 mg Oral Daily   Or  . thiamine  100 mg Intravenous Daily  . DISCONTD: insulin aspart protamine-insulin aspart  22 Units Subcutaneous BID WC  . DISCONTD: metoCLOPramide (REGLAN) injection  10 mg Intravenous Q6H    Assessment: Principal Problem:  *DKA, type 2 Active Problems:  Abdominal pain  Alcoholism  Nausea and vomiting  Elevated LFTs  Reflux esophagitis  Gastritis and duodenitis  Delayed gastric emptying   Plan: #1 poorly/uncontrolled type 2 diabetes with early DKA Questionable etiology. Abdominal x-ray with no acute findings. CT of the abdomen and  pelvis with severe hepatic steatosis. Anion gap is closed. Hemoglobin A1c is 11.1. Patient has been transitioned to subcutaneous insulin. Patient currently on the carb modified diet. NSL IVF. Increase  NPH 70/30 to 25units BID. Continue sliding scale insulin. Diabetes education.  #2 abdominal pain Likely secondary to reflux esophagitis, gastritis and duodenitis seen on upper EGD. Biopsies are pending. CT of the abdomen and pelvis shows severe hepatic steatosis. Continue PPI. Continue supportive care pain management. GI following and appreciate input and recommendations  #3 nausea vomiting  Improved. Likely secondary to problem #2 versus gastroparesis. Per upper EGD the patient  with delayed gastric emptying. Clinical improvement. Continue Reglan.  Antiemetics. Supportive care.  #4 reflux esophagitis Per upper EGD. PPI.  #5 elevated LFTs Questionable etiology. CT of the abdomen and pelvis with severe hepatic steatosis. Acute hepatitis panel with prob chronic hep c. LFTs now WNL.  #6 alcohol abuse No symptoms of withdrawal.Continue CIWA protocol.  #7 HTN Stable.   #8 prophylaxis PPI for GI prophylaxis, SCDs for DVT prophylaxis.   LOS: 4 days   Tiffony Kite 07/15/2011, 1:24 PM

## 2011-07-16 LAB — BASIC METABOLIC PANEL
BUN: 13 mg/dL (ref 6–23)
Calcium: 9.7 mg/dL (ref 8.4–10.5)
GFR calc Af Amer: >90 mL/min (ref >90)
GFR calc non Af Amer: >90 mL/min (ref >90)
Potassium: 3.8 mEq/L (ref 3.5–5.1)
Sodium: 131 mEq/L — ABNORMAL LOW (ref 135–145)

## 2011-07-16 LAB — GLUCOSE, CAPILLARY

## 2011-07-16 MED ORDER — ADULT MULTIVITAMIN W/MINERALS CH: 1.0000 | ORAL_TABLET | Freq: Every day | ORAL | Status: DC

## 2011-07-16 MED ORDER — INSULIN ASPART PROT & ASPART (70-30 MIX) 100 UNIT/ML ~~LOC~~ SUSP: 25.0000 [IU] | Freq: Two times a day (BID) | SUBCUTANEOUS | Status: AC

## 2011-07-16 MED ORDER — METOCLOPRAMIDE HCL 10 MG PO TABS: 10.0000 mg | ORAL_TABLET | Freq: Three times a day (TID) | ORAL | Status: AC

## 2011-07-16 MED ORDER — THIAMINE HCL 100 MG PO TABS: 100.0000 mg | ORAL_TABLET | Freq: Every day | ORAL | Status: AC

## 2011-07-16 MED ORDER — FOLIC ACID 1 MG PO TABS: 1.0000 mg | ORAL_TABLET | Freq: Every day | ORAL | Status: AC

## 2011-07-16 MED ORDER — PRAVASTATIN SODIUM 20 MG PO TABS: 20.0000 mg | ORAL_TABLET | Freq: Every day | ORAL | Status: AC

## 2011-07-16 MED ORDER — PANTOPRAZOLE SODIUM 40 MG PO TBEC: 40.0000 mg | DELAYED_RELEASE_TABLET | Freq: Every day | ORAL | Status: DC

## 2011-07-16 NOTE — Discharge Summary (Signed)
Discharge Summary  SAM OVERBECK MR#: [id number]  DOB:October 17, 1955  Date of Admission: 07/11/2011 Date of Discharge: 07/16/2011  Patient's PCP: Sheila Oats, MD, MD  Attending Physician:Kaimana Neuzil  Consults:   #1 gastroenterology:  [facility]: Dr. Rhea Belton  Discharge Diagnoses: DKA, type 2 Present on Admission:  .DKA, type 2 .Abdominal pain .Alcoholism .Nausea and vomiting .Elevated LFTs .Reflux esophagitis .Gastritis and duodenitis .Delayed gastric emptying   Brief Admitting History and Physical 56 year-old male with known history of diabetes mellitus type 2 and hypertension who has not been taking his medications for many months now as he has not been able to afford it came to the ER because of persistent abdominal pain her last 3-4 days and he thought his blood sugar also was running high. In the ER he was found to have a sugar over 350 with anion gap of 18 at this time patientis admitted for abdominal pain with uncontrolled diabetes with early DKA. Patient still abdominal pain in epigastric area non-radiating constant and increasing with food. He did throw up couple of times but no blood in it. Denies any diarrhea. Denies any chest pain or shortness of breath. For the rest of admission history and physical please see H&P dictated by Dr.Kakrakandy.   Discharge Medications Medication List  As of 07/16/2011  9:43 AM   START taking these medications         folic acid 1 MG tablet   Commonly known as: FOLVITE   Take 1 tablet (1 mg total) by mouth daily.      insulin aspart protamine-insulin aspart (70-30) 100 UNIT/ML injection   Commonly known as: NOVOLOG 70/30   Inject 25 Units into the skin 2 (two) times daily with a meal.      metoCLOPramide 10 MG tablet   Commonly known as: REGLAN   Take 1 tablet (10 mg total) by mouth 4 (four) times daily -  before meals and at bedtime.      mulitivitamin with minerals Tabs   Take 1 tablet by mouth daily.      pantoprazole 40  MG tablet   Commonly known as: PROTONIX   Take 1 tablet (40 mg total) by mouth daily.      pravastatin 20 MG tablet   Commonly known as: PRAVACHOL   Take 1 tablet (20 mg total) by mouth daily.      thiamine 100 MG tablet   Take 1 tablet (100 mg total) by mouth daily.          Where to get your medications    These are the prescriptions that you need to pick up.   You may get these medications from any pharmacy.         folic acid 1 MG tablet   insulin aspart protamine-insulin aspart (70-30) 100 UNIT/ML injection   metoCLOPramide 10 MG tablet   mulitivitamin with minerals Tabs   pantoprazole 40 MG tablet   pravastatin 20 MG tablet   thiamine 100 MG tablet           Hospital Course: DKA, type 2 Patient on admission was noted to be in early DKA with blood sugars greater than 350 and I man gap of 18. Abdominal x-rays were obtained as patient was complaining of abdominal pain which were negative. Patient was placed on IV fluids as well as placed on a glucose stabilizer. Patient was monitored and treated symptomatically. CT scan of the abdomen and pelvis was subsequently done due to patient's nausea vomiting and abdominal  pain which showed severe hepatic steatosis. Patient was subsequently transitioned from the glucose stabilizer to subcutaneous insulin. Due to patient's financial constraints he was placed on NPH 70/30 which had to be up titrated secondary to elevated CBGs. Hemoglobin A1c which was obtained came back elevated at 11.1. A consult to diabetes education was also obtained. Patient improved clinically and his anion gap closed and he was maintained on 70/30 as well as a sliding scale insulin. Deep to patient's nausea vomiting and history of alcohol abuse a GI consultation was obtained and patient subsequently did have an upper EGD done. Upper EGD which was done did show a reflux esophagitis in the distal esophagus showed a possible short segment of Barrett's mild gastritis  biopsies were performed and were pending at the time of discharge patient was also noted to have delayed gastric emptying as well. Patient was maintained on a proton pump inhibitor and Reglan was added to his regimen secondary to gastroparesis. Patient improved clinically did not have any further nausea no vomiting no abdominal pain and will be discharged home in stable and improved condition. Patient is to pick a PCP and he has been instructed to followup with his PCP one to 2 weeks post discharge. Patient will need further management of his diabetes as outpatient. Patient be discharged in stable and improved condition.  #2 reflux esophagitis/gastritis and duodenitis/gastroparesis On presentation patient presented with epigastric abdominal pain, nausea and vomiting. A GI consultation was obtained secondary to patient's history of alcohol use and patient's history of peptic ulcer disease per patient. Patient was seen in consultation by Dr. Sharla Kidney of the bowel a GI and patient subsequently underwent an upper endoscopy on 07/13/2011. Upper endoscopy did show reflux esophagitis, short Barrett's esophagus possibly biopsies were pending at the time of discharge and will need to be followed up upon and also did show a gastritis and duodenitis. And did show a delayed gastric emptying. Patient was maintained on a proton pump inhibitor and Reglan was added to his regimen. Patient had symptomatic improvement his diet was advanced and he was tolerating a copy modified diet by day of discharge. On day of discharge patient did not have any further nausea vomiting or abdominal pain. Patient be discharged in stable and improved condition.  #3 transaminitis Patient on admission was noted to have a transaminitis. Acute hepatitis panel which was done did show probable chronic hepatitis C. Patient will need to followup with PCP as outpatient terms of this. Patient was monitored acute abdominal series which was done was negative,  CT of the abdomen and pelvis which was done did show a severe hepatic steatosis. Patient's transaminitis improved on a daily basis and had resolved by day of discharge. Patient will need to followup with his PCP as outpatient as patient has been started on Pravachol.  #4 hyperlipidemia During this hospitalization the lipid panel was obtained which showed a total cholesterol 205 triglycerides of 326 HDL of 33 an LDL of 107. Due to patient's risk factors of diabetes he will be discharged on Pravachol as outpatient and will need to followup with PCP in terms of this.  The rest of patient's chronic medical issues remained stable throughout the hospitalization the patient be discharged in stable and improved condition. It's been a pleasure taking care of Mr. Austad.  Present on Admission:  .DKA, type 2 .Abdominal pain .Alcoholism .Nausea and vomiting .Elevated LFTs .Reflux esophagitis .Gastritis and duodenitis .Delayed gastric emptying   Day of Discharge BP 132/82  Pulse 77  Temp(Src) 97.1 F (36.2 C) (Oral)  Resp 18  Ht 5\' 8"  (1.727 m)  Wt 87.3 kg (192 lb 7.4 oz)  BMI 29.26 kg/m2  SpO2 95% Subjective: Patient denies any nausea, no abdominal pain, no emesis. Patient tolerating an oral diet. General: Alert, awake, oriented x3, in no acute distress. Heart: Regular rate and rhythm, without murmurs, rubs, gallops. Lungs: Clear to auscultation bilaterally. Abdomen: Soft, nontender, nondistended, positive bowel sounds. Extremities: No clubbing cyanosis or edema with positive pedal pulses. Neuro: Grossly intact, nonfocal.   Results for orders placed during the hospital encounter of 07/11/11 (from the past 48 hour(s))  GLUCOSE, CAPILLARY     Status: Abnormal   Collection Time   07/14/11 11:12 AM      Component Value Range Comment   Glucose-Capillary 232 (*) 70 - 99 (mg/dL)    Comment 1 Notify RN     GLUCOSE, CAPILLARY     Status: Abnormal   Collection Time   07/14/11  4:05 PM       Component Value Range Comment   Glucose-Capillary 215 (*) 70 - 99 (mg/dL)    Comment 1 Notify RN     GLUCOSE, CAPILLARY     Status: Abnormal   Collection Time   07/14/11  9:14 PM      Component Value Range Comment   Glucose-Capillary 309 (*) 70 - 99 (mg/dL)    Comment 1 Notify RN     GLUCOSE, CAPILLARY     Status: Abnormal   Collection Time   07/14/11  9:19 PM      Component Value Range Comment   Glucose-Capillary 306 (*) 70 - 99 (mg/dL)   GLUCOSE, CAPILLARY     Status: Abnormal   Collection Time   07/15/11 12:45 AM      Component Value Range Comment   Glucose-Capillary 279 (*) 70 - 99 (mg/dL)    Comment 1 Notify RN     BASIC METABOLIC PANEL     Status: Abnormal   Collection Time   07/15/11  5:00 AM      Component Value Range Comment   Sodium 133 (*) 135 - 145 (mEq/L)    Potassium 3.4 (*) 3.5 - 5.1 (mEq/L)    Chloride 97  96 - 112 (mEq/L)    CO2 25  19 - 32 (mEq/L)    Glucose, Bld 255 (*) 70 - 99 (mg/dL)    BUN 10  6 - 23 (mg/dL)    Creatinine, Ser 1.61  0.50 - 1.35 (mg/dL)    Calcium 9.7  8.4 - 10.5 (mg/dL)    GFR calc non Af Amer >90  >90 (mL/min)    GFR calc Af Amer >90  >90 (mL/min)   CBC     Status: Abnormal   Collection Time   07/15/11  5:00 AM      Component Value Range Comment   WBC 9.2  4.0 - 10.5 (K/uL)    RBC 4.82  4.22 - 5.81 (MIL/uL)    Hemoglobin 16.2  13.0 - 17.0 (g/dL)    HCT 09.6  04.5 - 40.9 (%)    MCV 91.5  78.0 - 100.0 (fL)    MCH 33.6  26.0 - 34.0 (pg)    MCHC 36.7 (*) 30.0 - 36.0 (g/dL)    RDW 81.1  91.4 - 78.2 (%)    Platelets 164  150 - 400 (K/uL)   GLUCOSE, CAPILLARY     Status: Abnormal   Collection Time   07/15/11  6:58  AM      Component Value Range Comment   Glucose-Capillary 292 (*) 70 - 99 (mg/dL)    Comment 1 Notify RN     GLUCOSE, CAPILLARY     Status: Abnormal   Collection Time   07/15/11 10:51 AM      Component Value Range Comment   Glucose-Capillary 362 (*) 70 - 99 (mg/dL)   GLUCOSE, CAPILLARY     Status: Abnormal   Collection  Time   07/15/11  3:38 PM      Component Value Range Comment   Glucose-Capillary 324 (*) 70 - 99 (mg/dL)   GLUCOSE, CAPILLARY     Status: Abnormal   Collection Time   07/15/11  9:09 PM      Component Value Range Comment   Glucose-Capillary 277 (*) 70 - 99 (mg/dL)   BASIC METABOLIC PANEL     Status: Abnormal   Collection Time   07/16/11  5:35 AM      Component Value Range Comment   Sodium 131 (*) 135 - 145 (mEq/L)    Potassium 3.8  3.5 - 5.1 (mEq/L)    Chloride 98  96 - 112 (mEq/L)    CO2 23  19 - 32 (mEq/L)    Glucose, Bld 245 (*) 70 - 99 (mg/dL)    BUN 13  6 - 23 (mg/dL)    Creatinine, Ser 1.47  0.50 - 1.35 (mg/dL)    Calcium 9.7  8.4 - 10.5 (mg/dL)    GFR calc non Af Amer >90  >90 (mL/min)    GFR calc Af Amer >90  >90 (mL/min)     Ct Abdomen Pelvis W Contrast  07/12/2011  *RADIOLOGY REPORT*  Clinical Data: Lower abdominal pain.  Nausea and vomiting.  CT ABDOMEN AND PELVIS WITH CONTRAST  Technique:  Multidetector CT imaging of the abdomen and pelvis was performed following the standard protocol during bolus administration of intravenous contrast.  Contrast: OMNIPAQUE IOHEXOL 300 MG/ML IJ SOLN  Comparison: 06/11/2010  Findings: Severe hepatic steatosis and focal sparing adjacent to the porta hepatis appears stable.  No liver masses are identified. Gallbladder is unremarkable, and there is no evidence of biliary ductal dilatation.  The pancreas, spleen, adrenal glands, and left kidney remain normal appearance.  5.5 cm right renal cyst remains stable and there is no evidence of renal mass or hydronephrosis.  No soft tissue masses or lymphadenopathy identified elsewhere within the abdomen or pelvis. Shotty lymph nodes in the porta hepatis and portacaval space remains stable and are not pathologically enlarged.  No evidence of inflammatory process or abnormal fluid collections. No evidence of bowel wall thickening or dilatation.  No hernia identified.  IMPRESSION:  1.  No acute findings. 2.   Stable severe hepatic steatosis.  Original Report Authenticated By: Danae Orleans, M.D.   Dg Abd Acute W/chest  07/12/2011  *RADIOLOGY REPORT*  Clinical Data: Abdominal pain  ACUTE ABDOMEN SERIES (ABDOMEN 2 VIEW & CHEST 1 VIEW)  Comparison: 06/10/2010  Findings: Low lung volumes.  Vascular congestion.  Upper normal heart size.  No free intraperitoneal gas.  No disproportionate dilatation of bowel.  Nonspecific small bowel and gastric air fluid levels are noted.  IMPRESSION: No evidence of free intraperitoneal gas.  Nonobstructive bowel gas pattern.  Low lung volumes.  Vascular congestion.  Original Report Authenticated By: Donavan Burnet, M.D.     Disposition: Home  Diet: Carb modified  Activity: Increase activity slowly   Follow-up Appts: Discharge Orders  Future Orders Please Complete By Expires   Diet Carb Modified      Increase activity slowly      Discharge instructions      Comments:   Follow up with PCP in 1-2 weeks       TESTS THAT NEED FOLLOW-UP Biopsies from EGD. Patient will need repeat CT of the abdomen and pelvis in 3 months to followup on adenopathy. Patient will also need CMET IN 6-8 WEEKS to f/u LFTs.  Time spent on discharge, talking to the patient, and coordinating care: .   SignedRamiro Harvest 07/16/2011, 9:43 AM

## 2011-07-16 NOTE — Progress Notes (Signed)
   CARE MANAGEMENT NOTE 07/16/2011  Patient:  Carl Cooke, Carl Cooke   Account Number:  [id number]  Date Initiated:  07/13/2011  Documentation initiated by:  [facility]  Subjective/Objective Assessment:   Admitted with DKA. Lives with wife, independent wit mobility and ADLs.     Action/Plan:   referral to CSW for alcohol abuse   Anticipated DC Date:  07/15/2011   Anticipated DC Plan:  HOME/SELF CARE  In-house referral  Clinical Social Worker      DC Associate Professor  CM consult  Medication Assistance      [facility] Choice  HOME HEALTH   Choice offered to / List presented to:  [name]        HH arranged  HH-2 PT      [facility] agency  [facility]   Status of service:  Completed, signed off Medicare Important Message given?   (If response is "NO", the following Medicare IM given date fields will be blank) Date Medicare IM given:   Date Additional Medicare IM given:    Discharge Disposition:  HOME W HOME HEALTH SERVICES  Per UR Regulation:    If discussed at Long Length of Stay Meetings, dates discussed:    Comments:  317/2013 0930 Spoke to pt and explained [facility] has financial hardship program for [facility]. Will send 3 day supply of meds to main pharmacy to use [facility] med assistance fund. Contacted [facility] to make aware of pt's scheduled d/c today. Isidoro Donning RN CCM Case Mgmt phone [phone]  07/13/11 Spoke with patient about PCP. [facility] not currently taking new patents, gave patient information on [facility]. Gave patient pharmacy discount card and checked with pharmacy that patient is eligible for indigent fund. CM will continue to follow. Jacquelynn Cree RN, BSN, CCM

## 2011-07-18 ENCOUNTER — Encounter: Payer: Self-pay | Admitting: Internal Medicine

## 2011-08-24 ENCOUNTER — Telehealth: Payer: Self-pay | Admitting: Internal Medicine

## 2011-08-24 MED ORDER — OMEPRAZOLE 20 MG PO CPDR: 20.0000 mg | DELAYED_RELEASE_CAPSULE | Freq: Every day | ORAL | Status: DC

## 2011-08-24 NOTE — Telephone Encounter (Signed)
See previous encounter

## 2011-08-24 NOTE — Telephone Encounter (Signed)
lmom for pt to call back

## 2011-08-24 NOTE — Telephone Encounter (Signed)
Informed pt that his Barrett's is not grounds for disability; it can be arrested with PPI's. Pt stated he has diabetes, HTN, high cholesterol, COPD, a tomor on his back and now, Barrett's. He is trying to get medicaid, but he needs a doctor. I suggested [facility] or [facility] and pt stated he went to [facility], but had transportation problems and they probably won't see him again. Informed him I can give him samples of PPI's. Spoke with Dr Rhea Belton who stated to give pt Prilosec samples. Informed pt I will leave samples at the front desk; pt stated understanding.

## 2012-01-23 ENCOUNTER — Encounter: Payer: Self-pay | Admitting: Gastroenterology

## 2012-01-25 ENCOUNTER — Encounter: Payer: Self-pay | Admitting: Internal Medicine

## 2012-03-30 IMAGING — CT CT ABD-PELV W/ CM
2 of 5 series · 17 of 46 positions shown, 19 images · IV contrast (APPLIED)
Comparison: None

CLINICAL DATA: Abdominal and pelvic pain.

CT ABDOMEN AND PELVIS WITH CONTRAST
TECHNIQUE: Multidetector CT imaging of the abdomen and pelvis was
performed following the standard protocol during bolus
administration of intravenous contrast.
Contrast: 125 ml intravenous Amnipaque-UEE

[Series 2: abd_pel 5.0 b40f st · axial · 0.85mm/px · z∈[-509,-64]mm · 14 of 101 slices shown, 16 images]
[im 6/101  soft-tissue]
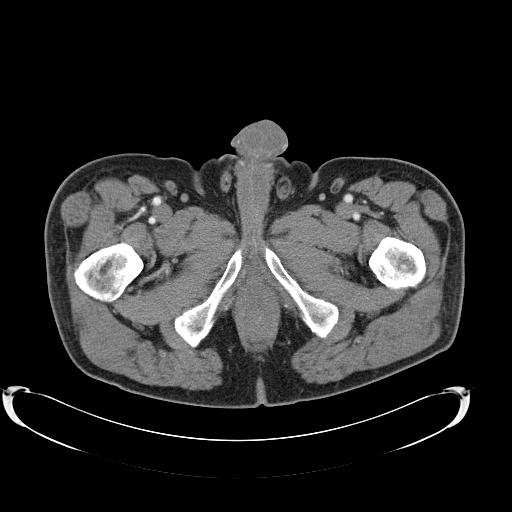
[im 6/101  bone]
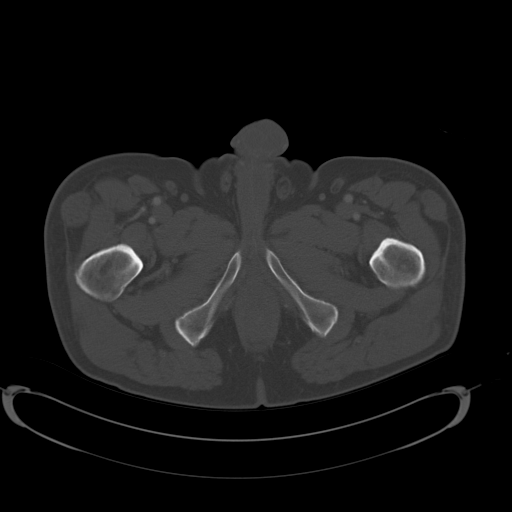
[im 12/101  soft-tissue]
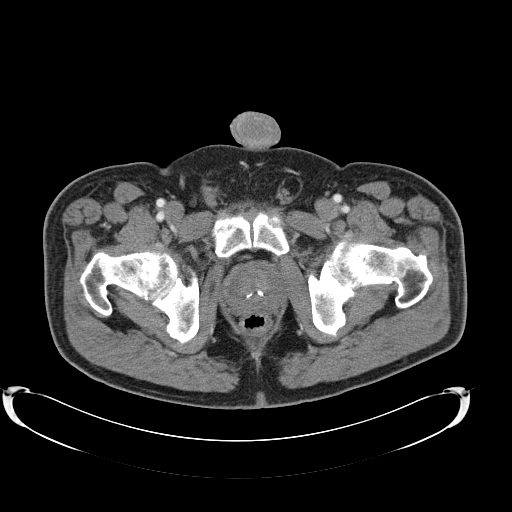
[im 23/101  soft-tissue]
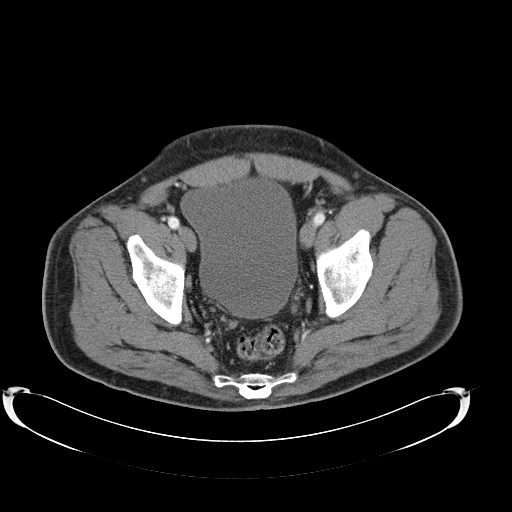
[im 28/101  soft-tissue]
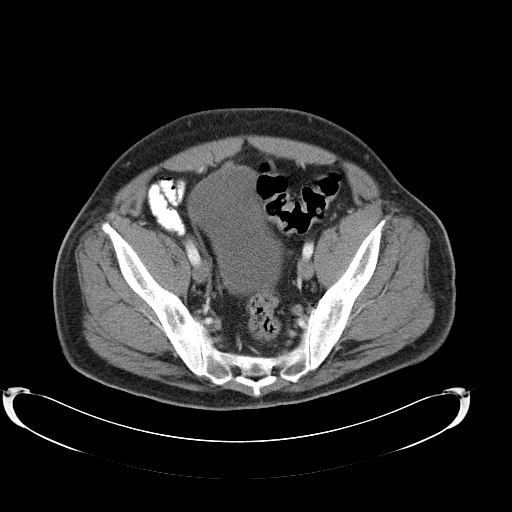
[im 34/101  soft-tissue]
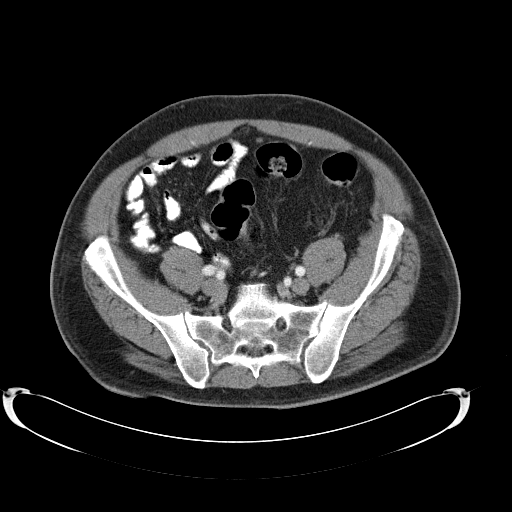
[im 39/101  soft-tissue]
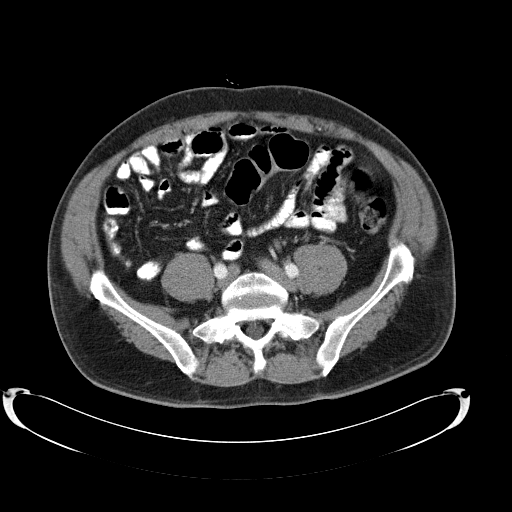
[im 45/101  soft-tissue]
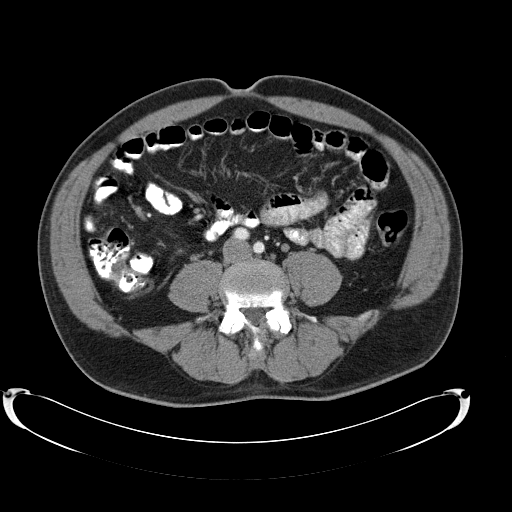
[im 56/101  soft-tissue]
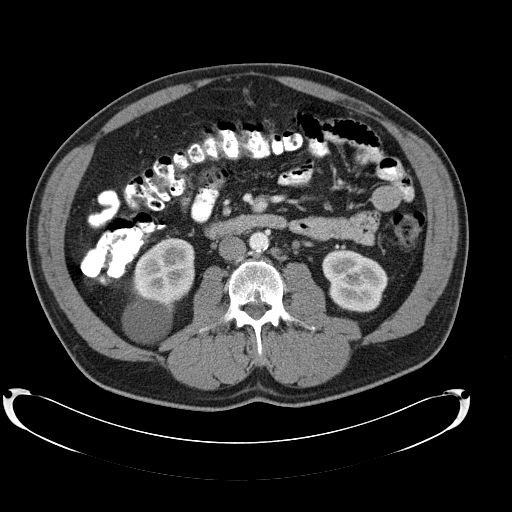
[im 62/101  soft-tissue]
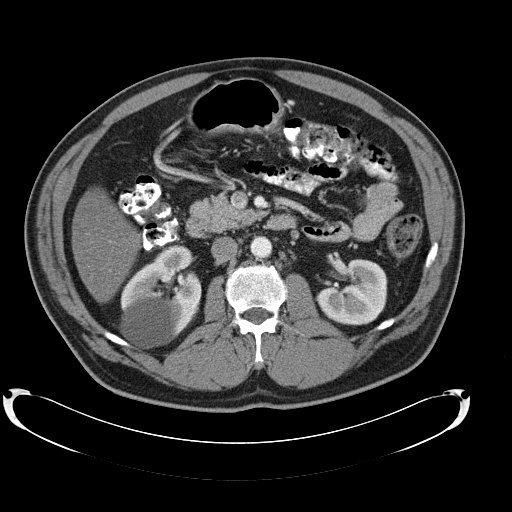
[im 62/101  bone]
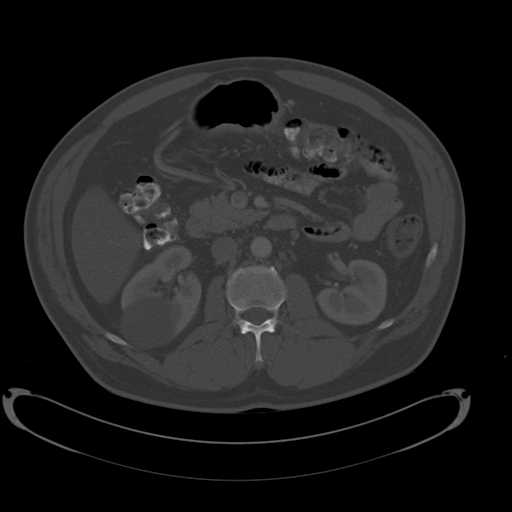
[im 67/101  soft-tissue]
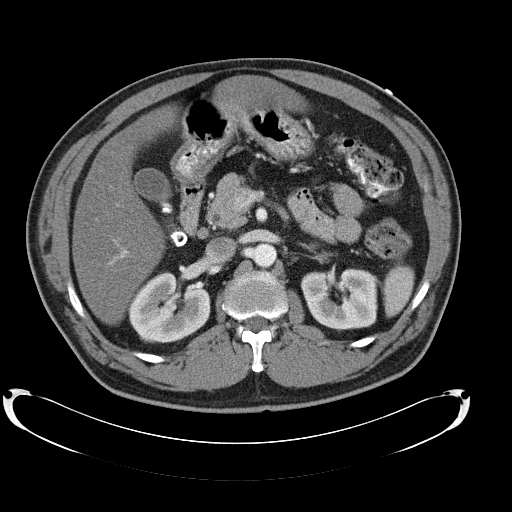
[im 73/101  soft-tissue]
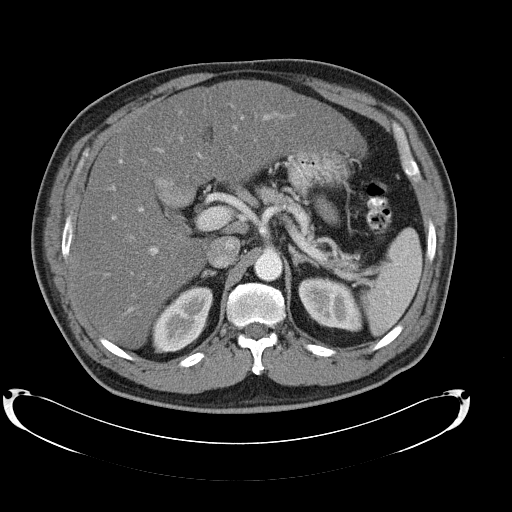
[im 78/101  soft-tissue]
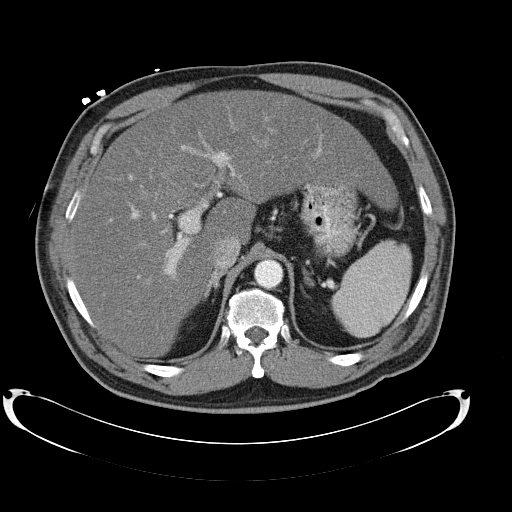
[im 89/101  soft-tissue]
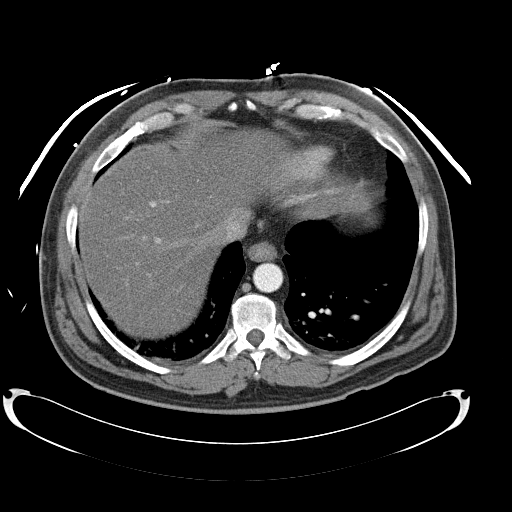
[im 95/101  soft-tissue]
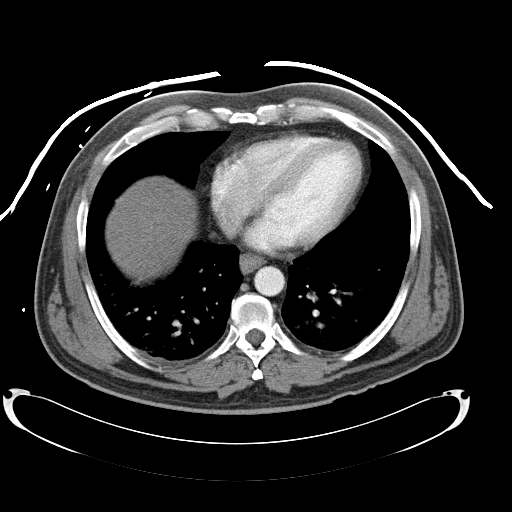

[Series 602: coronal abdomen · coronal · 1.02mm/px · 3 of 144 slices shown]
[im 48/144  soft-tissue]
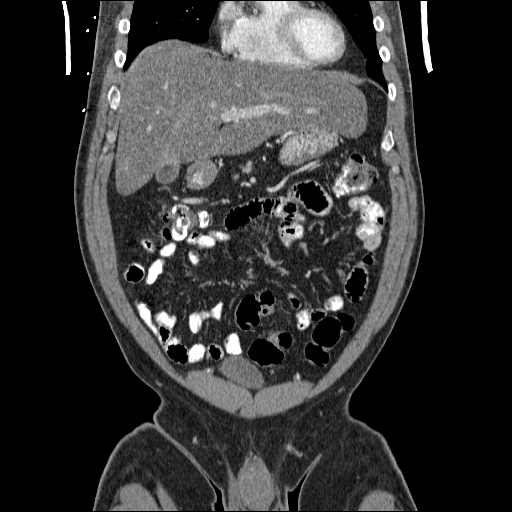
[im 64/144  soft-tissue]
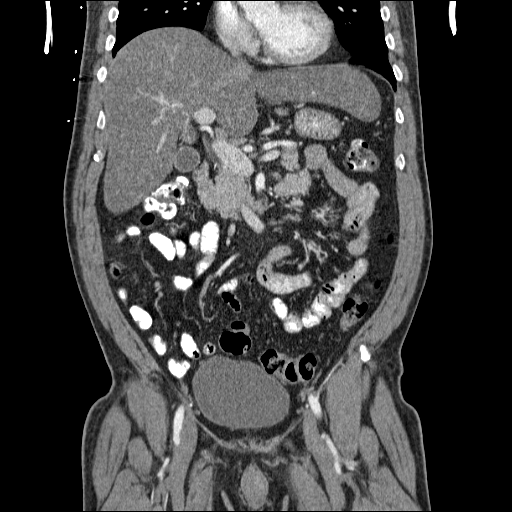
[im 80/144  soft-tissue]
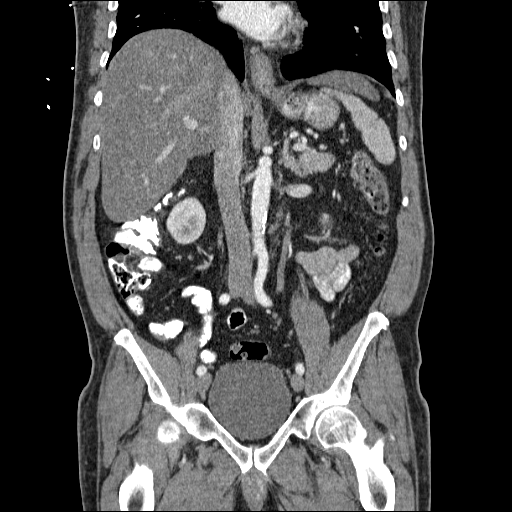

[17 of 46 positions shown; findings below may reference images not displayed]

FINDINGS: Mild bibasilar atelectasis noted.

Hepatomegaly and diffuse fatty infiltration of the liver
identified.
The spleen, pancreas, gallbladder, adrenal glands, and kidneys are
unremarkable except for a right renal cyst.
Mildly enlarged celiac, periportal and portocaval lymph nodes are
noted.  An index periportal  node measures 1.5 x 1.8 cm (image 31).

There is no evidence of biliary dilatation, free fluid or abdominal
aortic aneurysm.

The bowel and bladder are within normal limits.
No acute or suspicious bony abnormalities are present.
IMPRESSION: Hepatomegaly and diffuse fatty infiltration of the liver.

Mildly enlarged upper abdominal lymph nodes.  These are most likely
reactive but consider CT follow-up in 3 months.

## 2012-08-14 ENCOUNTER — Encounter: Payer: Self-pay | Admitting: Internal Medicine

## 2013-04-30 IMAGING — CR DG ABDOMEN ACUTE W/ 1V CHEST
3 series · 3 of 3 positions shown · non-contrast
Comparison: 06/10/2010

CLINICAL DATA: Abdominal pain

ACUTE ABDOMEN SERIES (ABDOMEN 2 VIEW & CHEST 1 VIEW)

[w chest pa]
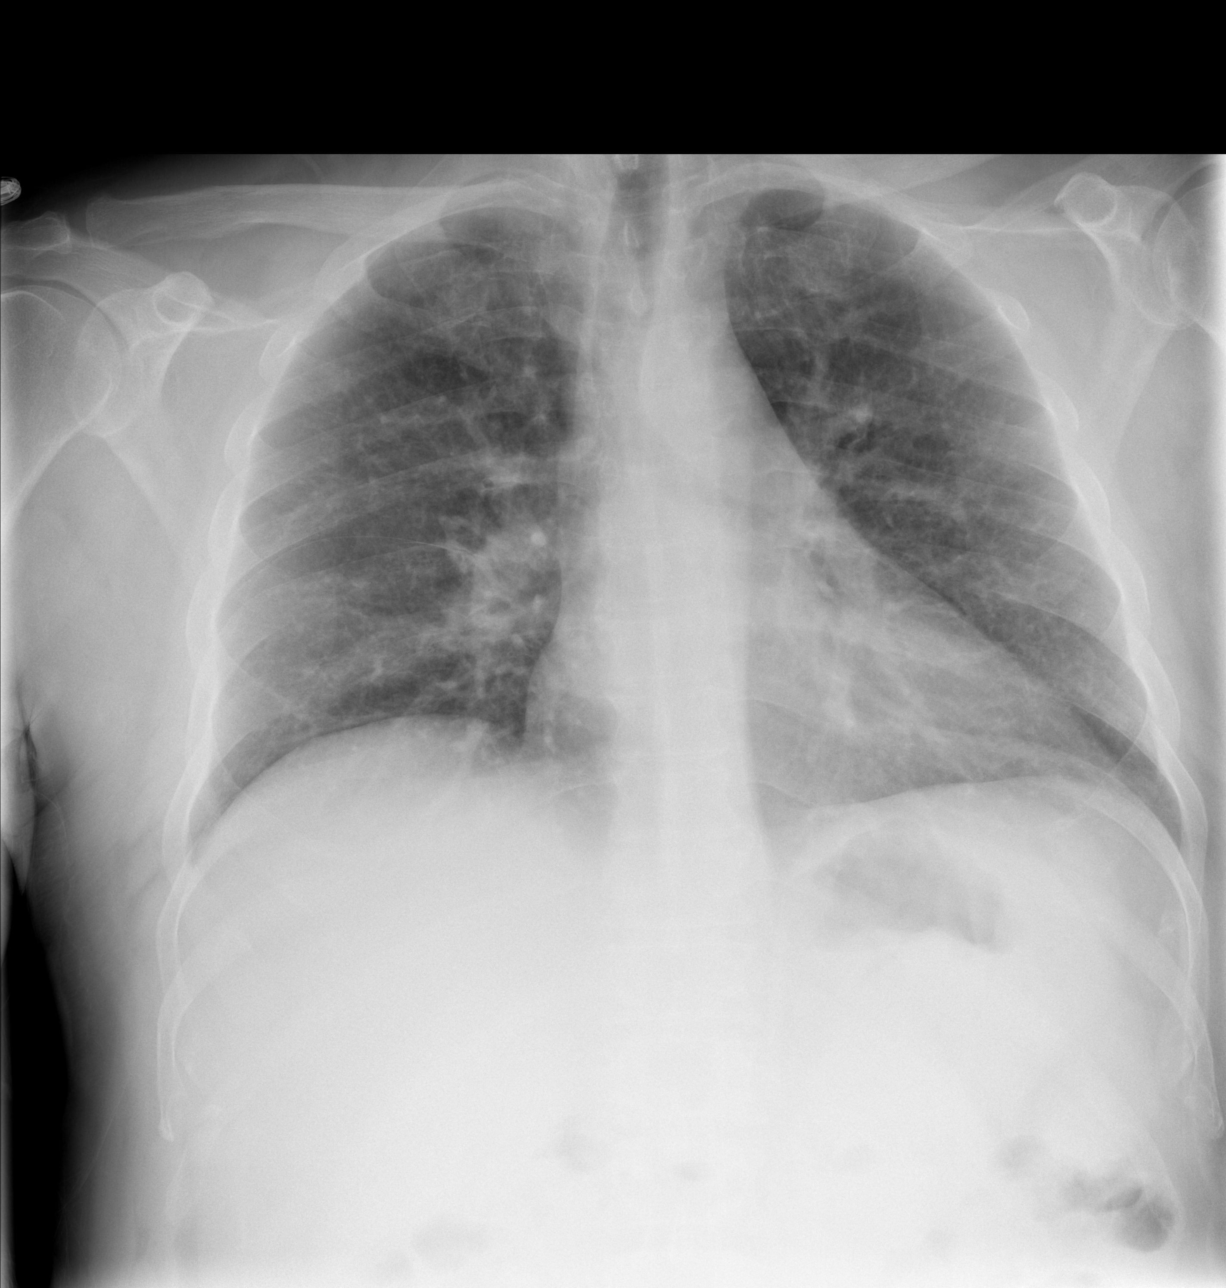

[w abdomen upright]
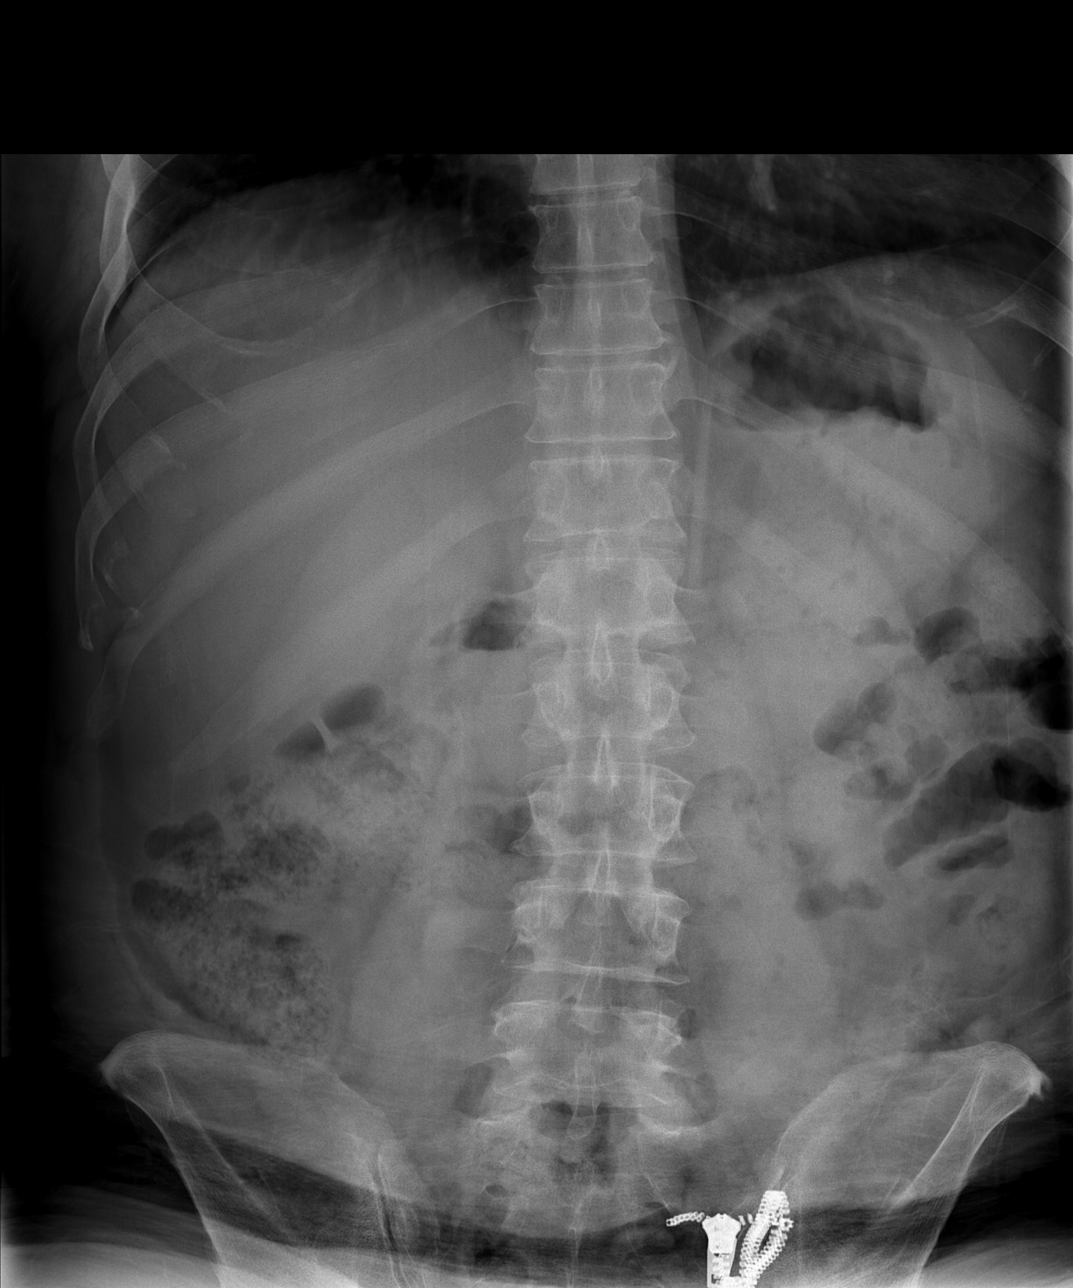

[t abdomen supine]
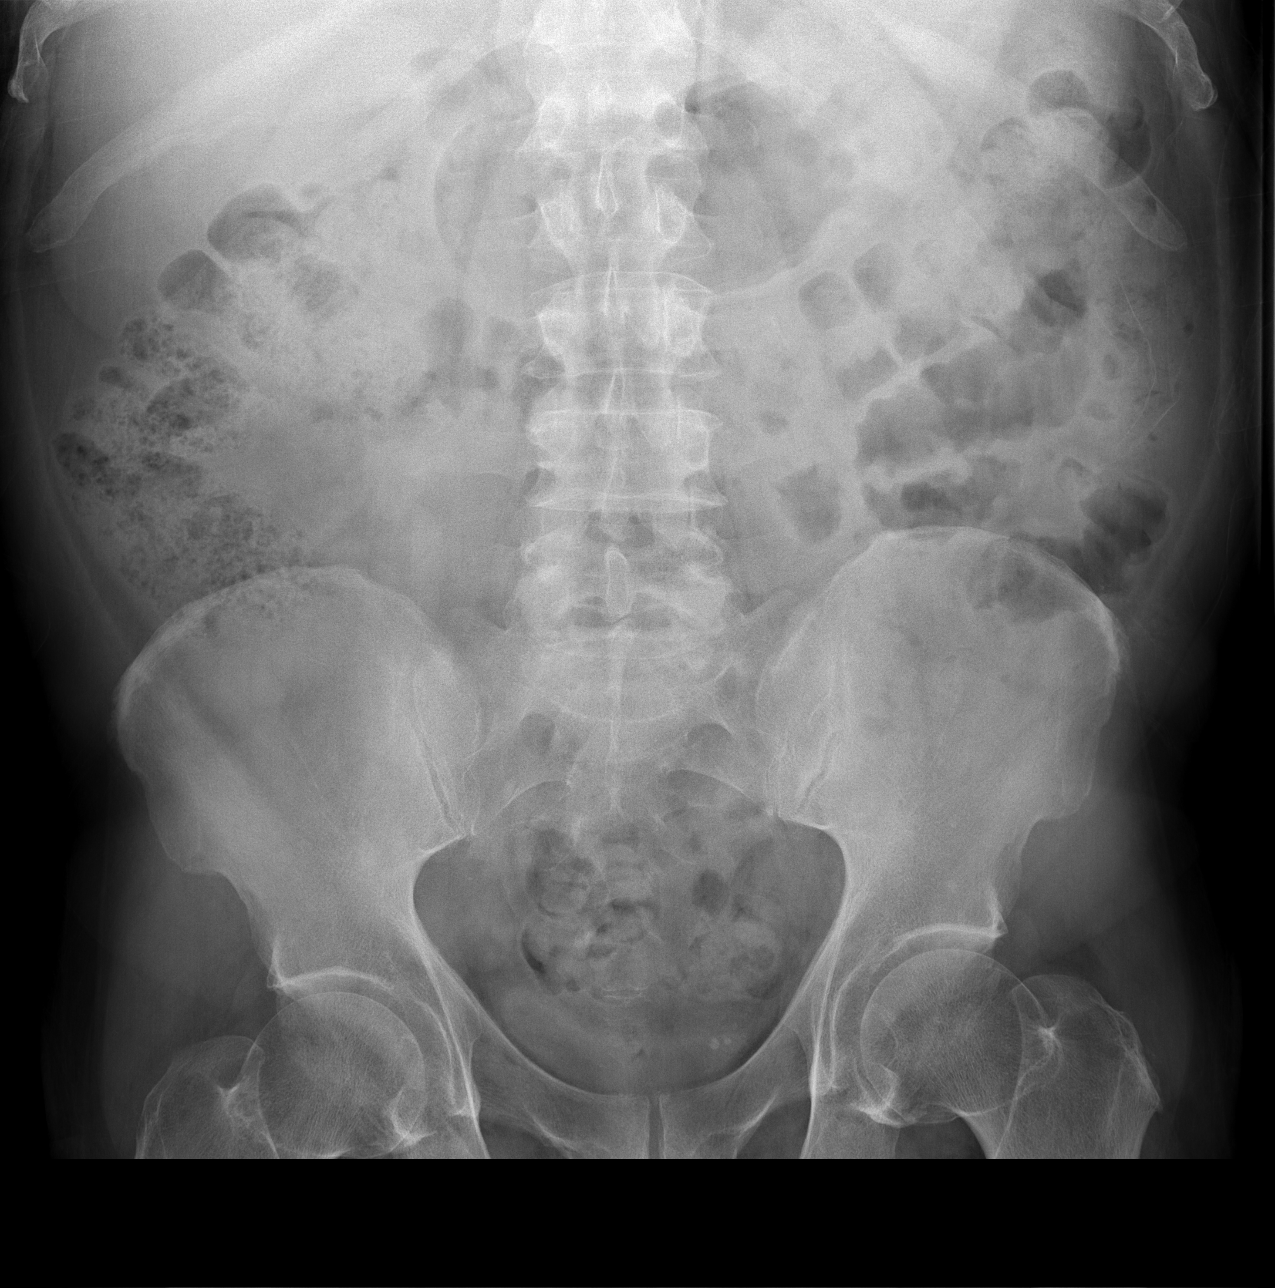

[3 of 3 positions shown; findings below may reference images not displayed]

FINDINGS: Low lung volumes.  Vascular congestion.  Upper normal
heart size.  No free intraperitoneal gas.  No disproportionate
dilatation of bowel.  Nonspecific small bowel and gastric air fluid
levels are noted.
IMPRESSION: No evidence of free intraperitoneal gas.  Nonobstructive bowel gas
pattern.  Low lung volumes.  Vascular congestion.

## 2018-04-16 ENCOUNTER — Ambulatory Visit (HOSPITAL_COMMUNITY): Admission: EM | Admit: 2018-04-16 | Discharge: 2018-04-16 | Discharge status: Against Medical Advice | Payer: Self-pay

## 2018-04-16 NOTE — ED Notes (Signed)
PT was called more than once to complete registration per registration staff. PT was sitting under TV in lobby and reports he did not hear registration call his name. PT made aware he is next to come back, PT states "f*ck this. I'm leaving." PT LWBS and leaves without triage.

## 2018-05-06 DIAGNOSIS — E119 Type 2 diabetes mellitus without complications: Secondary | ICD-10-CM | POA: Diagnosis not present

## 2018-05-06 DIAGNOSIS — I10 Essential (primary) hypertension: Secondary | ICD-10-CM | POA: Diagnosis not present

## 2018-05-06 DIAGNOSIS — E785 Hyperlipidemia, unspecified: Secondary | ICD-10-CM | POA: Diagnosis not present

## 2018-10-18 DIAGNOSIS — Z20828 Contact with and (suspected) exposure to other viral communicable diseases: Secondary | ICD-10-CM | POA: Diagnosis not present

## 2018-10-30 DIAGNOSIS — I10 Essential (primary) hypertension: Secondary | ICD-10-CM | POA: Diagnosis not present

## 2018-10-30 DIAGNOSIS — E785 Hyperlipidemia, unspecified: Secondary | ICD-10-CM | POA: Diagnosis not present

## 2018-10-30 DIAGNOSIS — E119 Type 2 diabetes mellitus without complications: Secondary | ICD-10-CM | POA: Diagnosis not present

## 2018-11-16 DIAGNOSIS — E785 Hyperlipidemia, unspecified: Secondary | ICD-10-CM | POA: Diagnosis not present

## 2018-11-16 DIAGNOSIS — E1140 Type 2 diabetes mellitus with diabetic neuropathy, unspecified: Secondary | ICD-10-CM | POA: Diagnosis not present

## 2018-11-16 DIAGNOSIS — I10 Essential (primary) hypertension: Secondary | ICD-10-CM | POA: Diagnosis not present

## 2018-11-16 DIAGNOSIS — E119 Type 2 diabetes mellitus without complications: Secondary | ICD-10-CM | POA: Diagnosis not present

## 2019-02-05 ENCOUNTER — Ambulatory Visit (INDEPENDENT_AMBULATORY_CARE_PROVIDER_SITE_OTHER): Payer: Medicaid Other

## 2019-02-05 ENCOUNTER — Ambulatory Visit: Payer: Medicaid Other | Admitting: Podiatry

## 2019-02-05 ENCOUNTER — Other Ambulatory Visit: Payer: Self-pay

## 2019-02-05 DIAGNOSIS — M7751 Other enthesopathy of right foot: Secondary | ICD-10-CM

## 2019-02-05 DIAGNOSIS — E0843 Diabetes mellitus due to underlying condition with diabetic autonomic (poly)neuropathy: Secondary | ICD-10-CM

## 2019-02-05 DIAGNOSIS — L989 Disorder of the skin and subcutaneous tissue, unspecified: Secondary | ICD-10-CM | POA: Diagnosis not present

## 2019-02-05 DIAGNOSIS — B351 Tinea unguium: Secondary | ICD-10-CM | POA: Diagnosis not present

## 2019-02-05 DIAGNOSIS — M79676 Pain in unspecified toe(s): Secondary | ICD-10-CM

## 2019-02-05 DIAGNOSIS — M79671 Pain in right foot: Secondary | ICD-10-CM

## 2019-02-05 DIAGNOSIS — M778 Other enthesopathies, not elsewhere classified: Secondary | ICD-10-CM

## 2019-02-06 ENCOUNTER — Other Ambulatory Visit: Payer: Self-pay | Admitting: Podiatry

## 2019-02-06 DIAGNOSIS — M778 Other enthesopathies, not elsewhere classified: Secondary | ICD-10-CM

## 2019-02-09 NOTE — Progress Notes (Signed)
    Subjective: Patient is a 63 y.o. male with PMHx of diabetes mellitus presenting to the office today as a new patient with a chief complaint of a painful lesion(s) noted to the right great toe that has been present for the past 1.5 weeks. Walking increases the pain. He denies any known trauma or injury. He has not done anything for treatment and denies drainage. Patient also complains of elongated, thickened nails that cause pain while ambulating in shoes. He is unable to trim his own nails. Patient presents today for further treatment and evaluation.  Past Medical History:  Diagnosis Date  . COPD (chronic obstructive pulmonary disease)    per MD told him he has COPD  . Delayed gastric emptying 07/13/2011  . Diabetes mellitus   . Gastritis and duodenitis 07/13/2011  . GERD (gastroesophageal reflux disease)   . Headache   . Hypertension   . Reflux esophagitis 07/13/2011  . Shortness of breath     Objective:  Physical Exam General: Alert and oriented x3 in no acute distress  Dermatology: Hyperkeratotic lesion(s) present on the right great toe. Pain on palpation with a central nucleated core noted. Skin is warm, dry and supple bilateral lower extremities. Negative for open lesions or macerations. Nails are tender, long, thickened and dystrophic with subungual debris, consistent with onychomycosis, 1-5 bilateral. No signs of infection noted.  Vascular: Palpable pedal pulses bilaterally. No edema or erythema noted. Capillary refill within normal limits.  Neurological: Epicritic and protective threshold diminished bilaterally.   Musculoskeletal Exam: Pain on palpation at the keratotic lesion(s) noted. Range of motion within normal limits bilateral. Muscle strength 5/5 in all groups bilateral.  Radiographic Exam:  Normal osseous mineralization. Joint spaces preserved. No fracture/dislocation/boney destruction.    Assessment: 1. Onychodystrophic nails 1-5 bilateral with hyperkeratosis of  nails.  2. Onychomycosis of nail due to dermatophyte bilateral 3. Pre-ulcerative callus lesion to the right great toe 4. Diabetes mellitus with polyneuropathy    Plan of Care:  1. Patient evaluated. 2. Excisional debridement of keratoic lesion(s) using a chisel blade was performed without incident.  3. Dressed with light dressing. 4. Mechanical debridement of nails 1-5 bilaterally performed using a nail nipper. Filed with dremel without incident.  5. Silvadene cream provided to patient.  6. Silicone toe caps provided to patient.  7. Patient is to return to the clinic in 3 months.   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   Edrick Kins, DPM [facility]  Dr. Edrick Kins, [address]                                        [place], [place] [address]                Office [phone]  Fax [phone]

## 2019-04-03 DIAGNOSIS — E785 Hyperlipidemia, unspecified: Secondary | ICD-10-CM | POA: Diagnosis not present

## 2019-04-03 DIAGNOSIS — I10 Essential (primary) hypertension: Secondary | ICD-10-CM | POA: Diagnosis not present

## 2019-04-03 DIAGNOSIS — E119 Type 2 diabetes mellitus without complications: Secondary | ICD-10-CM | POA: Diagnosis not present

## 2019-04-03 DIAGNOSIS — E1140 Type 2 diabetes mellitus with diabetic neuropathy, unspecified: Secondary | ICD-10-CM | POA: Diagnosis not present

## 2019-04-13 ENCOUNTER — Emergency Department (HOSPITAL_COMMUNITY): Payer: Medicaid Other

## 2019-04-13 ENCOUNTER — Emergency Department (HOSPITAL_COMMUNITY)
Admission: EM | Admit: 2019-04-13 | Discharge: 2019-04-14 | Disposition: A | Discharge status: Home / Self Care | Payer: Medicaid Other | Attending: Emergency Medicine | Admitting: Emergency Medicine

## 2019-04-13 ENCOUNTER — Other Ambulatory Visit: Payer: Self-pay

## 2019-04-13 ENCOUNTER — Encounter (HOSPITAL_COMMUNITY): Payer: Self-pay

## 2019-04-13 DIAGNOSIS — R519 Headache, unspecified: Secondary | ICD-10-CM | POA: Insufficient documentation

## 2019-04-13 DIAGNOSIS — F17210 Nicotine dependence, cigarettes, uncomplicated: Secondary | ICD-10-CM | POA: Diagnosis not present

## 2019-04-13 DIAGNOSIS — R0689 Other abnormalities of breathing: Secondary | ICD-10-CM | POA: Diagnosis not present

## 2019-04-13 DIAGNOSIS — I10 Essential (primary) hypertension: Secondary | ICD-10-CM | POA: Diagnosis not present

## 2019-04-13 DIAGNOSIS — Z794 Long term (current) use of insulin: Secondary | ICD-10-CM | POA: Diagnosis not present

## 2019-04-13 DIAGNOSIS — I6521 Occlusion and stenosis of right carotid artery: Secondary | ICD-10-CM | POA: Diagnosis not present

## 2019-04-13 DIAGNOSIS — Z79899 Other long term (current) drug therapy: Secondary | ICD-10-CM | POA: Insufficient documentation

## 2019-04-13 DIAGNOSIS — J449 Chronic obstructive pulmonary disease, unspecified: Secondary | ICD-10-CM | POA: Insufficient documentation

## 2019-04-13 DIAGNOSIS — E1165 Type 2 diabetes mellitus with hyperglycemia: Secondary | ICD-10-CM | POA: Diagnosis not present

## 2019-04-13 DIAGNOSIS — I4510 Unspecified right bundle-branch block: Secondary | ICD-10-CM | POA: Diagnosis not present

## 2019-04-13 DIAGNOSIS — R42 Dizziness and giddiness: Secondary | ICD-10-CM | POA: Diagnosis not present

## 2019-04-13 DIAGNOSIS — H81399 Other peripheral vertigo, unspecified ear: Secondary | ICD-10-CM | POA: Insufficient documentation

## 2019-04-13 DIAGNOSIS — E119 Type 2 diabetes mellitus without complications: Secondary | ICD-10-CM | POA: Diagnosis not present

## 2019-04-13 DIAGNOSIS — G4489 Other headache syndrome: Secondary | ICD-10-CM | POA: Diagnosis not present

## 2019-04-13 DIAGNOSIS — R0902 Hypoxemia: Secondary | ICD-10-CM | POA: Diagnosis not present

## 2019-04-13 DIAGNOSIS — R52 Pain, unspecified: Secondary | ICD-10-CM | POA: Diagnosis not present

## 2019-04-13 LAB — CBC WITH DIFFERENTIAL/PLATELET
Abs Immature Granulocytes: 0.07 10*3/uL (ref 0.00–0.07)
Basophils Absolute: 0.1 10*3/uL (ref 0.0–0.1)
Basophils Relative: 0 %
Eosinophils Absolute: 0.1 10*3/uL (ref 0.0–0.5)
Eosinophils Relative: 1 %
HCT: 47.4 % (ref 39.0–52.0)
Hemoglobin: 16.2 g/dL (ref 13.0–17.0)
Immature Granulocytes: 1 %
Lymphocytes Relative: 15 %
Lymphs Abs: 1.9 10*3/uL (ref 0.7–4.0)
MCH: 32.2 pg (ref 26.0–34.0)
MCHC: 34.2 g/dL (ref 30.0–36.0)
MCV: 94.2 fL (ref 80.0–100.0)
Monocytes Absolute: 1.2 10*3/uL — ABNORMAL HIGH (ref 0.1–1.0)
Monocytes Relative: 10 %
Neutro Abs: 9.2 10*3/uL — ABNORMAL HIGH (ref 1.7–7.7)
Neutrophils Relative %: 73 %
Platelets: 185 10*3/uL (ref 150–400)
RBC: 5.03 MIL/uL (ref 4.22–5.81)
RDW: 12.1 % (ref 11.5–15.5)
WBC: 12.6 10*3/uL — ABNORMAL HIGH (ref 4.0–10.5)
nRBC: 0 % (ref 0.0–0.2)

## 2019-04-13 LAB — COMPREHENSIVE METABOLIC PANEL WITH GFR
ALT: 29 U/L (ref 0–44)
AST: 27 U/L (ref 15–41)
Albumin: 4 g/dL (ref 3.5–5.0)
Alkaline Phosphatase: 67 U/L (ref 38–126)
Anion gap: 11 (ref 5–15)
BUN: 12 mg/dL (ref 8–23)
CO2: 21 mmol/L — ABNORMAL LOW (ref 22–32)
Calcium: 9.6 mg/dL (ref 8.9–10.3)
Chloride: 104 mmol/L (ref 98–111)
Creatinine, Ser: 1.07 mg/dL (ref 0.61–1.24)
GFR calc Af Amer: >60 mL/min (ref >60)
GFR calc non Af Amer: >60 mL/min (ref >60)
Glucose, Bld: 183 mg/dL — ABNORMAL HIGH (ref 70–99)
Potassium: 3.7 mmol/L (ref 3.5–5.1)
Sodium: 136 mmol/L (ref 135–145)
Total Bilirubin: 1 mg/dL (ref 0.3–1.2)
Total Protein: 6.8 g/dL (ref 6.5–8.1)

## 2019-04-13 LAB — CBG MONITORING, ED: Glucose-Capillary: 186 mg/dL — ABNORMAL HIGH (ref 70–99)

## 2019-04-13 LAB — I-STAT CHEM 8, ED
BUN: 14 mg/dL (ref 8–23)
Calcium, Ion: 1.23 mmol/L (ref 1.15–1.40)
Chloride: 102 mmol/L (ref 98–111)
Creatinine, Ser: 1 mg/dL (ref 0.61–1.24)
Glucose, Bld: 180 mg/dL — ABNORMAL HIGH (ref 70–99)
HCT: 46 % (ref 39.0–52.0)
Hemoglobin: 15.6 g/dL (ref 13.0–17.0)
Potassium: 3.6 mmol/L (ref 3.5–5.1)
Sodium: 137 mmol/L (ref 135–145)
TCO2: 24 mmol/L (ref 22–32)

## 2019-04-13 LAB — URINALYSIS, ROUTINE W REFLEX MICROSCOPIC
Bilirubin Urine: NEGATIVE
Glucose, UA: NEGATIVE mg/dL
Hgb urine dipstick: NEGATIVE
Ketones, ur: NEGATIVE mg/dL
Leukocytes,Ua: NEGATIVE
Nitrite: NEGATIVE
Protein, ur: NEGATIVE mg/dL
Specific Gravity, Urine: 1.005 (ref 1.005–1.030)
pH: 5 (ref 5.0–8.0)

## 2019-04-13 MED ORDER — IOHEXOL 350 MG/ML SOLN
75.0000 mL | Freq: Once | INTRAVENOUS | Status: AC | PRN
  Administered 2019-04-13: 75 mL via INTRAVENOUS

## 2019-04-13 NOTE — ED Triage Notes (Signed)
Pt BIB [facility] for eval of dizzines and HA. Ems reports that he was driving home d/t profound dizziness, blurry vision bilaterally. Ems reports R sided weakness on ambulation, but states stroke scale was neg. Pt reports that he is experiencing weakness, hx of HTN.

## 2019-04-13 NOTE — ED Provider Notes (Signed)
[facility] EMERGENCY DEPARTMENT Provider Note   CSN: [id number] Arrival date & time: 04/13/19  2020     History No chief complaint on file.   Carl Cooke is a 63 y.o. male.  Patient is a 63 year old male with a history of COPD, diabetes, GERD, hypertension who presents with dizziness and headache.  He states about 5:00 this afternoon he developed onset of dizziness which she could not really describe as a spinning sensation.  He said it was more when he stood up but not really related to turning his head from side to side.  He did have some nausea associated with that and a headache which he describes as a pressure feeling in the top of his head and it radiates down his neck.  He denies any vomiting.  No associated chest pain or shortness of breath.  He does have tingling in his hands and his feet on both sides.  He denies any weakness in his arms or his legs.  He has no difficulty with his balance or trouble walking.  He feels like his vision is blurry but it is on both sides.  He said he did have a brief episode where he had some double vision but denies any double vision currently.  No history of similar symptoms in the past.        Past Medical History:  Diagnosis Date  . COPD (chronic obstructive pulmonary disease) ([place])    per MD told him he has COPD  . Delayed gastric emptying 07/13/2011  . Diabetes mellitus   . Gastritis and duodenitis 07/13/2011  . GERD (gastroesophageal reflux disease)   . Headache(784.0)   . Hypertension   . Reflux esophagitis 07/13/2011  . Shortness of breath     Patient Active Problem List   Diagnosis Date Noted  . Reflux esophagitis 07/13/2011  . Gastritis and duodenitis 07/13/2011  . Delayed gastric emptying 07/13/2011  . DKA, type 2 ([place]) 07/12/2011  . Abdominal pain 07/12/2011  . Alcoholism ([place]) 07/12/2011  . Nausea and vomiting 07/12/2011  . Elevated LFTs 07/12/2011    Past Surgical History:  Procedure Laterality  Date  . ESOPHAGOGASTRODUODENOSCOPY  07/13/2011   Procedure: ESOPHAGOGASTRODUODENOSCOPY (EGD);  Surgeon: Jerene Bears, MD;  Location: [facility];  Service: Gastroenterology;  Laterality: N/A;  . LAPAROSCOPIC GASTROTOMY W/ REPAIR OF ULCER    . MANDIBLE FRACTURE SURGERY         Family History  Problem Relation Age of Onset  . Diabetes type II Brother   . Anesthesia problems Neg Hx     Social History   Tobacco Use  . Smoking status: Current Every Day Smoker    Packs/day: 1.00    Years: 40.00    Pack years: 40.00    Types: Cigarettes  . Smokeless tobacco: Never Used  Substance Use Topics  . Alcohol use: Yes    Alcohol/week: 12.0 standard drinks    Types: 12 Cans of beer per week  . Drug use: No    Home Medications Prior to Admission medications   Medication Sig Start Date End Date Taking? Authorizing Provider  albuterol (PROAIR HFA) 108 (90 Base) MCG/ACT inhaler Inhale 1-2 puffs into the lungs every 6 (six) hours as needed for wheezing or shortness of breath.   Yes [provider]  Aspirin-Acetaminophen-Caffeine (GOODY HEADACHE PO) Take 1 packet by mouth as needed (for headaches).   Yes [provider]  atorvastatin (LIPITOR) 40 MG tablet Take 40 mg by mouth  daily.   Yes [provider]  gabapentin (NEURONTIN) 100 MG capsule Take 100 mg by mouth 3 (three) times daily.   Yes [provider]  glipiZIDE (GLUCOTROL) 10 MG tablet Take 10 mg by mouth 2 (two) times daily before a meal.    Yes [provider]  insulin glargine (LANTUS) 100 UNIT/ML injection Inject 22 Units into the skin 2 (two) times daily before a meal.    Yes [provider]  lisinopril (ZESTRIL) 20 MG tablet Take 20 mg by mouth daily.   Yes [provider]  neomycin-bacitracin-polymyxin (NEOSPORIN) [phone] ointment Apply 1 application topically See admin instructions. Apply as directed to affected area of toe on the right foot one to two times daily  during dressing changes   Yes [provider]  pantoprazole (PROTONIX) 20 MG tablet Take 20 mg by mouth daily at 6 PM.   Yes [provider]  pioglitazone (ACTOS) 30 MG tablet Take 30 mg by mouth daily.   Yes [provider]  silver sulfADIAZINE (SILVADENE) 1 % cream Apply 1 application topically See admin instructions. Apply as directed to affected area of toe on the right foot one to two times daily during dressing changes   Yes [provider]  Multiple Vitamin (MULITIVITAMIN WITH MINERALS) TABS Take 1 tablet by mouth daily. Patient not taking: Reported on 04/13/2019 07/16/11   Eugenie Filler, MD  omeprazole (PRILOSEC) 20 MG capsule Take 1 capsule (20 mg total) by mouth daily. Patient not taking: Reported on 04/13/2019 08/24/11 04/13/19  Pyrtle, Lajuan Lines, MD    Allergies    Patient has no known allergies.  Review of Systems   Review of Systems  Constitutional: Negative for chills, diaphoresis, fatigue and fever.  HENT: Negative for congestion, rhinorrhea and sneezing.   Eyes: Negative.   Respiratory: Negative for cough, chest tightness and shortness of breath.   Cardiovascular: Negative for chest pain and leg swelling.  Gastrointestinal: Positive for nausea. Negative for abdominal pain, blood in stool, diarrhea and vomiting.  Genitourinary: Negative for difficulty urinating, flank pain, frequency and hematuria.  Musculoskeletal: Negative for arthralgias and back pain.  Skin: Negative for rash.  Neurological: Positive for dizziness, numbness and headaches. Negative for speech difficulty and weakness.    Physical Exam Updated Vital Signs BP (!) 147/67   Pulse 66   Temp 97.7 F (36.5 C) (Oral)   Resp (!) 21   Ht 5\' 8"  (1.727 m)   Wt 111.1 kg   SpO2 94%   BMI 37.25 kg/m   Physical Exam Constitutional:      Appearance: He is well-developed.  HENT:     Head: Normocephalic and atraumatic.  Eyes:     Pupils: Pupils are equal, round, and  reactive to light.     Comments: Positive horizontal nystagmus, no vertical or rotational nystagmus, visual fields full to confrontation  Cardiovascular:     Rate and Rhythm: Normal rate and regular rhythm.     Heart sounds: Normal heart sounds.  Pulmonary:     Effort: Pulmonary effort is normal. No respiratory distress.     Breath sounds: Normal breath sounds. No wheezing or rales.  Chest:     Chest wall: No tenderness.  Abdominal:     General: Bowel sounds are normal.     Palpations: Abdomen is soft.     Tenderness: There is no abdominal tenderness. There is no guarding or rebound.  Musculoskeletal:        General: Normal  range of motion.     Cervical back: Normal range of motion and neck supple.  Lymphadenopathy:     Cervical: No cervical adenopathy.  Skin:    General: Skin is warm and dry.     Findings: No rash.  Neurological:     Mental Status: He is alert and oriented to person, place, and time.     Comments: Motor 5/5 all extremities Sensation grossly intact to LT all extremities Finger to Nose intact, no pronator drift CN II-XII grossly intact       ED Results / Procedures / Treatments   Labs (all labs ordered are listed, but only abnormal results are displayed) Labs Reviewed  COMPREHENSIVE METABOLIC PANEL - Abnormal; Notable for the following components:      Result Value   CO2 21 (*)    Glucose, Bld 183 (*)    All other components within normal limits  CBC WITH DIFFERENTIAL/PLATELET - Abnormal; Notable for the following components:   WBC 12.6 (*)    Neutro Abs 9.2 (*)    Monocytes Absolute 1.2 (*)    All other components within normal limits  URINALYSIS, ROUTINE W REFLEX MICROSCOPIC - Abnormal; Notable for the following components:   Color, Urine STRAW (*)    All other components within normal limits  CBG MONITORING, ED - Abnormal; Notable for the following components:   Glucose-Capillary 186 (*)    All other components within normal limits  I-STAT CHEM  8, ED - Abnormal; Notable for the following components:   Glucose, Bld 180 (*)    All other components within normal limits    EKG EKG Interpretation  Date/Time:  Sunday April 13 2019 20:39:51 EST Ventricular Rate:  76 PR Interval:    QRS Duration: 112 QT Interval:  395 QTC Calculation: 445 R Axis:   68 Text Interpretation: Sinus rhythm Incomplete right bundle branch block since last tracing no significant change Confirmed by Malvin Johns [phone]) on 04/13/2019 10:26:26 PM   Radiology CT Angio Head W or Wo Contrast  Result Date: 04/13/2019 CLINICAL DATA:  Dizziness. Vision change. Rule out subarachnoid hemorrhage. EXAM: CT ANGIOGRAPHY HEAD AND NECK TECHNIQUE: Multidetector CT imaging of the head and neck was performed using the standard protocol during bolus administration of intravenous contrast. Multiplanar CT image reconstructions and MIPs were obtained to evaluate the vascular anatomy. Carotid stenosis measurements (when applicable) are obtained utilizing NASCET criteria, using the distal internal carotid diameter as the denominator. CONTRAST:  36mL OMNIPAQUE IOHEXOL 350 MG/ML SOLN COMPARISON:  CT head 07/08/2005 FINDINGS: CT HEAD FINDINGS Brain: No evidence of acute infarction, hemorrhage, hydrocephalus, extra-axial collection or mass lesion/mass effect. Vascular: Negative for hyperdense vessel Skull: Negative for fracture Sinuses: Mild mucosal edema in the maxillary sinus bilaterally. Orbits: Negative Review of the MIP images confirms the above findings CTA NECK FINDINGS Aortic arch: Standard branching. Imaged portion shows no evidence of aneurysm or dissection. No significant stenosis of the major arch vessel origins. Right carotid system: Atherosclerotic disease right carotid bifurcation. Right internal carotid artery narrowed by approximately 50% diameter stenosis. Left carotid system: Mild atherosclerotic disease left carotid bifurcation without significant stenosis. Vertebral  arteries: Both vertebral arteries appear normal bilaterally. Skeleton: Cervical spondylosis. No acute skeletal abnormality. Other neck: Negative Upper chest: Lung apices clear bilaterally. Review of the MIP images confirms the above findings CTA HEAD FINDINGS Anterior circulation: Internal carotid artery patent bilaterally without stenosis. Mild atherosclerotic calcification left cavernous carotid. Anterior and middle cerebral arteries widely patent bilaterally. Negative for aneurysm.  Posterior circulation: Both vertebral arteries patent to the basilar. PICA patent bilaterally. Basilar widely patent. Superior cerebellar and posterior cerebral arteries patent bilaterally without stenosis. Negative for aneurysm. Venous sinuses: Normal venous enhancement Anatomic variants: None Review of the MIP images confirms the above findings IMPRESSION: 1. 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 2. No significant left carotid or vertebral artery stenosis in the neck. 3. No significant intracranial stenosis. 4. Negative for cerebral aneurysm. 5. Negative CT head Electronically Signed   By: Franchot Gallo M.D.   On: 04/13/2019 21:52   CT Angio Neck W and/or Wo Contrast  Result Date: 04/13/2019 CLINICAL DATA:  Dizziness. Vision change. Rule out subarachnoid hemorrhage. EXAM: CT ANGIOGRAPHY HEAD AND NECK TECHNIQUE: Multidetector CT imaging of the head and neck was performed using the standard protocol during bolus administration of intravenous contrast. Multiplanar CT image reconstructions and MIPs were obtained to evaluate the vascular anatomy. Carotid stenosis measurements (when applicable) are obtained utilizing NASCET criteria, using the distal internal carotid diameter as the denominator. CONTRAST:  90mL OMNIPAQUE IOHEXOL 350 MG/ML SOLN COMPARISON:  CT head 07/08/2005 FINDINGS: CT HEAD FINDINGS Brain: No evidence of acute infarction, hemorrhage, hydrocephalus, extra-axial collection or mass lesion/mass  effect. Vascular: Negative for hyperdense vessel Skull: Negative for fracture Sinuses: Mild mucosal edema in the maxillary sinus bilaterally. Orbits: Negative Review of the MIP images confirms the above findings CTA NECK FINDINGS Aortic arch: Standard branching. Imaged portion shows no evidence of aneurysm or dissection. No significant stenosis of the major arch vessel origins. Right carotid system: Atherosclerotic disease right carotid bifurcation. Right internal carotid artery narrowed by approximately 50% diameter stenosis. Left carotid system: Mild atherosclerotic disease left carotid bifurcation without significant stenosis. Vertebral arteries: Both vertebral arteries appear normal bilaterally. Skeleton: Cervical spondylosis. No acute skeletal abnormality. Other neck: Negative Upper chest: Lung apices clear bilaterally. Review of the MIP images confirms the above findings CTA HEAD FINDINGS Anterior circulation: Internal carotid artery patent bilaterally without stenosis. Mild atherosclerotic calcification left cavernous carotid. Anterior and middle cerebral arteries widely patent bilaterally. Negative for aneurysm. Posterior circulation: Both vertebral arteries patent to the basilar. PICA patent bilaterally. Basilar widely patent. Superior cerebellar and posterior cerebral arteries patent bilaterally without stenosis. Negative for aneurysm. Venous sinuses: Normal venous enhancement Anatomic variants: None Review of the MIP images confirms the above findings IMPRESSION: 1. 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 2. No significant left carotid or vertebral artery stenosis in the neck. 3. No significant intracranial stenosis. 4. Negative for cerebral aneurysm. 5. Negative CT head Electronically Signed   By: Franchot Gallo M.D.   On: 04/13/2019 21:52    Procedures Procedures (including critical care time)  Medications Ordered in ED Medications  iohexol (OMNIPAQUE) 350 MG/ML injection  75 mL (75 mLs Intravenous Contrast Given 04/13/19 2123)    ED Course  I have reviewed the triage vital signs and the nursing notes.  Pertinent labs & imaging results that were available during my care of the patient were reviewed by me and considered in my medical decision making (see chart for details).    MDM Rules/Calculators/A&P     CHA2DS2/VAS Stroke Risk Points      N/A >= 2 Points: High Risk  1 - 1.99 Points: Medium Risk  0 Points: Low Risk    A final score could not be computed because of missing components.: Last  Change: N/A     This score determines the patient's risk of having a stroke if the  patient  has atrial fibrillation.      This score is not applicable to this patient. Components are not  calculated.                   Patient is a 63 year old male who presents with sudden onset of headache associated with dizziness although I cannot really distinguish whether it is vertiginous or lightheadedness.  He does have some associated nystagmus.  He has no current double vision, gaze deviation or visual field deficits.  I do not appreciate any unilateral deficits although EMS had reported some possible right-sided weakness on ambulation.  He had a CT scan of his head including an angiogram of his head and neck which showed no acute abnormality other than he does have 50% occlusion of the right carotid artery.  His labs are nonconcerning.  He is feeling a little bit better currently but given the questionable right-sided weakness, an MRI has been ordered and is still pending.  Dr. Tyrone Nine to take over pending MRI.  If this is normal, patient can likely be discharged home.  I did discuss with the patient that he will need to follow-up with his PCP regarding the carotid artery stenosis. Final Clinical Impression(s) / ED Diagnoses Final diagnoses:  None    Rx / DC Orders ED Discharge Orders    None       Malvin Johns, MD 04/13/19 2322

## 2019-04-14 DIAGNOSIS — R42 Dizziness and giddiness: Secondary | ICD-10-CM | POA: Diagnosis not present

## 2019-04-14 MED ORDER — MECLIZINE HCL 25 MG PO TABS: 25.0000 mg | ORAL_TABLET | Freq: Three times a day (TID) | ORAL | 0 refills | Status: DC | PRN

## 2019-04-14 NOTE — ED Notes (Signed)
Patient verbalizes understanding of discharge instructions. Opportunity for questioning and answers were provided. Armband removed by staff, pt discharged from ED. Pt. ambulatory and discharged home.  

## 2019-04-14 NOTE — ED Provider Notes (Signed)
I received the patient in signout from Dr. Tamera Punt, briefly the patient is a 63 year old male with a chief complaints of dizziness and headache.  CT angiogram of the head and neck were negative.  Plan for an MRI of the brain.  MRI of the brain is negative.  Patient was able to ambulate.  Discharge home.   Deno Etienne, DO 04/14/19 [phone]

## 2019-04-14 NOTE — Discharge Instructions (Signed)
Use the medication as prescribed.  Return for inability to walk.

## 2019-05-07 ENCOUNTER — Ambulatory Visit: Payer: Medicaid Other | Admitting: Podiatry

## 2019-05-07 ENCOUNTER — Encounter: Payer: Self-pay | Admitting: Podiatry

## 2019-05-07 ENCOUNTER — Other Ambulatory Visit: Payer: Self-pay

## 2019-05-07 DIAGNOSIS — E0843 Diabetes mellitus due to underlying condition with diabetic autonomic (poly)neuropathy: Secondary | ICD-10-CM

## 2019-05-07 DIAGNOSIS — M79676 Pain in unspecified toe(s): Secondary | ICD-10-CM | POA: Diagnosis not present

## 2019-05-07 DIAGNOSIS — B351 Tinea unguium: Secondary | ICD-10-CM | POA: Diagnosis not present

## 2019-05-07 DIAGNOSIS — L989 Disorder of the skin and subcutaneous tissue, unspecified: Secondary | ICD-10-CM

## 2019-05-11 NOTE — Progress Notes (Signed)
    Subjective: Patient is a 64 y.o. male presenting to the office today for follow up evaluation of painful callus lesion(s) noted to the bilateral great toes. He states the areas are painful again. He has not had any recent treatment for the complaint. Walking and bearing weight increases the symptoms.  Patient also complains of elongated, thickened nails that cause pain while ambulating in shoes. He is unable to trim his own nails. Patient presents today for further treatment and evaluation.  Past Medical History:  Diagnosis Date  . COPD (chronic obstructive pulmonary disease) ([place])    per MD told him he has COPD  . Delayed gastric emptying 07/13/2011  . Diabetes mellitus   . Gastritis and duodenitis 07/13/2011  . GERD (gastroesophageal reflux disease)   . Headache(784.0)   . Hypertension   . Reflux esophagitis 07/13/2011  . Shortness of breath     Objective:  Physical Exam General: Alert and oriented x3 in no acute distress  Dermatology: Hyperkeratotic lesion(s) present on the bilateral great toes. Pain on palpation with a central nucleated core noted. Skin is warm, dry and supple bilateral lower extremities. Negative for open lesions or macerations. Nails are tender, long, thickened and dystrophic with subungual debris, consistent with onychomycosis, 1-5 bilateral. No signs of infection noted.  Vascular: Palpable pedal pulses bilaterally. No edema or erythema noted. Capillary refill within normal limits.  Neurological: Epicritic and protective threshold diminished bilaterally.   Musculoskeletal Exam: Pain on palpation at the keratotic lesion(s) noted. Range of motion within normal limits bilateral. Muscle strength 5/5 in all groups bilateral.  Assessment: 1. Onychodystrophic nails 1-5 bilateral with hyperkeratosis of nails.  2. Onychomycosis of nail due to dermatophyte bilateral 3. Pre-ulcerative callus lesions noted to the bilateral great toes   Plan of Care:  1. Patient  evaluated. 2. Excisional debridement of keratoic lesion(s) using a chisel blade was performed without incident.  3. Dressed with light dressing. 4. Mechanical debridement of nails 1-5 bilaterally performed using a nail nipper. Filed with dremel without incident.  5. Patient is to return to the clinic in 3 months.   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   Edrick Kins, DPM [facility]  Dr. Edrick Kins, [address]                                        [place], [place] [address]                Office [phone]  Fax [phone]

## 2019-07-31 ENCOUNTER — Ambulatory Visit: Payer: Medicaid Other | Attending: Internal Medicine

## 2019-07-31 DIAGNOSIS — Z23 Encounter for immunization: Secondary | ICD-10-CM

## 2019-07-31 NOTE — Progress Notes (Signed)
   Covid-19 Vaccination Clinic  Name:  OLUFEMI AMADEO    MRN: [id number] DOB: 1956/03/31  07/31/2019  Mr. Byars was observed post Covid-19 immunization for 15 minutes without incident. He was provided with Vaccine Information Sheet and instruction to access the V-Safe system.   Mr. Penland was instructed to call 911 with any severe reactions post vaccine: Marland Kitchen Difficulty breathing  . Swelling of face and throat  . A fast heartbeat  . A bad rash all over body  . Dizziness and weakness   Immunizations Administered    Name Date Dose VIS Date Route   Pfizer COVID-19 Vaccine 07/31/2019  8:24 AM 0.3 mL 04/11/2019 Intramuscular   Manufacturer: [place]   Lot: [id number]   [place]: [id number]

## 2019-08-06 ENCOUNTER — Ambulatory Visit: Payer: Medicaid Other | Admitting: Podiatry

## 2019-08-25 ENCOUNTER — Ambulatory Visit: Payer: Medicaid Other | Attending: Internal Medicine

## 2019-08-25 DIAGNOSIS — Z23 Encounter for immunization: Secondary | ICD-10-CM

## 2019-08-25 NOTE — Progress Notes (Signed)
   Covid-19 Vaccination Clinic  Name:  Carl Cooke    MRN: [id number] DOB: 03-15-1956  08/25/2019  Mr. Hempen was observed post Covid-19 immunization for 15 minutes without incident. He was provided with Vaccine Information Sheet and instruction to access the V-Safe system.   Mr. Katzenmeyer was instructed to call 911 with any severe reactions post vaccine: Marland Kitchen Difficulty breathing  . Swelling of face and throat  . A fast heartbeat  . A bad rash all over body  . Dizziness and weakness   Immunizations Administered    Name Date Dose VIS Date Route   Pfizer COVID-19 Vaccine 08/25/2019  8:26 AM 0.3 mL 06/25/2018 Intramuscular   Manufacturer: [place]   Lot: [id number]   [place]: [id number]

## 2020-03-10 ENCOUNTER — Ambulatory Visit (INDEPENDENT_AMBULATORY_CARE_PROVIDER_SITE_OTHER): Payer: Medicaid Other | Admitting: Podiatry

## 2020-03-10 ENCOUNTER — Other Ambulatory Visit: Payer: Self-pay

## 2020-03-10 DIAGNOSIS — B351 Tinea unguium: Secondary | ICD-10-CM

## 2020-03-10 DIAGNOSIS — L989 Disorder of the skin and subcutaneous tissue, unspecified: Secondary | ICD-10-CM | POA: Diagnosis not present

## 2020-03-10 DIAGNOSIS — M79676 Pain in unspecified toe(s): Secondary | ICD-10-CM | POA: Diagnosis not present

## 2020-03-10 DIAGNOSIS — E0843 Diabetes mellitus due to underlying condition with diabetic autonomic (poly)neuropathy: Secondary | ICD-10-CM | POA: Diagnosis not present

## 2020-03-10 NOTE — Progress Notes (Signed)
° ° °  Subjective: Patient is a 64 y.o. male presenting to the office today for follow up evaluation of painful callus lesion(s) noted to the bilateral great toes. He states the areas are painful again. He has not had any recent treatment for the complaint. Walking and bearing weight increases the symptoms.  Patient also complains of elongated, thickened nails that cause pain while ambulating in shoes. He is unable to trim his own nails. Patient presents today for further treatment and evaluation.  Past Medical History:  Diagnosis Date   COPD (chronic obstructive pulmonary disease) ([place])    per MD told him he has COPD   Delayed gastric emptying 07/13/2011   Diabetes mellitus    Gastritis and duodenitis 07/13/2011   GERD (gastroesophageal reflux disease)    Headache(784.0)    Hypertension    Reflux esophagitis 07/13/2011   Shortness of breath     Objective:  Physical Exam General: Alert and oriented x3 in no acute distress  Dermatology: Hyperkeratotic lesion(s) present on the bilateral great toes. Pain on palpation with a central nucleated core noted. Skin is warm, dry and supple bilateral lower extremities. Negative for open lesions or macerations. Nails are tender, long, thickened and dystrophic with subungual debris, consistent with onychomycosis, 1-5 bilateral. No signs of infection noted.  Vascular: Palpable pedal pulses bilaterally. No edema or erythema noted. Capillary refill within normal limits.  Neurological: Epicritic and protective threshold diminished bilaterally.   Musculoskeletal Exam: Pain on palpation at the keratotic lesion(s) noted. Range of motion within normal limits bilateral. Muscle strength 5/5 in all groups bilateral.  Assessment: 1. Onychodystrophic nails 1-5 bilateral with hyperkeratosis of nails.  2. Onychomycosis of nail due to dermatophyte bilateral 3. Pre-ulcerative callus lesions noted to the bilateral great toes   Plan of Care:  1. Patient  evaluated. 2. Excisional debridement of keratoic lesion(s) using a chisel blade was performed without incident.  3. Dressed with light dressing. 4. Mechanical debridement of nails 1-5 bilaterally performed using a nail nipper. Filed with dremel without incident.  5. Patient is to return to the clinic in 3 months.   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   Edrick Kins, DPM [facility]  Dr. Edrick Kins, [address]                                        [place], [place] [address]                Office [phone]  Fax [phone]

## 2020-04-19 ENCOUNTER — Other Ambulatory Visit: Payer: Self-pay

## 2020-04-19 ENCOUNTER — Emergency Department (HOSPITAL_COMMUNITY)
Admission: EM | Admit: 2020-04-19 | Discharge: 2020-04-19 | Disposition: A | Discharge status: Home / Self Care | Payer: Medicaid Other | Attending: Emergency Medicine | Admitting: Emergency Medicine

## 2020-04-19 DIAGNOSIS — I10 Essential (primary) hypertension: Secondary | ICD-10-CM | POA: Diagnosis not present

## 2020-04-19 DIAGNOSIS — J449 Chronic obstructive pulmonary disease, unspecified: Secondary | ICD-10-CM | POA: Diagnosis not present

## 2020-04-19 DIAGNOSIS — M5442 Lumbago with sciatica, left side: Secondary | ICD-10-CM | POA: Diagnosis not present

## 2020-04-19 DIAGNOSIS — Z79899 Other long term (current) drug therapy: Secondary | ICD-10-CM | POA: Diagnosis not present

## 2020-04-19 DIAGNOSIS — R109 Unspecified abdominal pain: Secondary | ICD-10-CM | POA: Diagnosis present

## 2020-04-19 DIAGNOSIS — R52 Pain, unspecified: Secondary | ICD-10-CM | POA: Diagnosis not present

## 2020-04-19 DIAGNOSIS — Z794 Long term (current) use of insulin: Secondary | ICD-10-CM | POA: Diagnosis not present

## 2020-04-19 DIAGNOSIS — E1165 Type 2 diabetes mellitus with hyperglycemia: Secondary | ICD-10-CM | POA: Insufficient documentation

## 2020-04-19 DIAGNOSIS — M79605 Pain in left leg: Secondary | ICD-10-CM | POA: Diagnosis not present

## 2020-04-19 DIAGNOSIS — R739 Hyperglycemia, unspecified: Secondary | ICD-10-CM

## 2020-04-19 DIAGNOSIS — M549 Dorsalgia, unspecified: Secondary | ICD-10-CM | POA: Diagnosis not present

## 2020-04-19 DIAGNOSIS — F17210 Nicotine dependence, cigarettes, uncomplicated: Secondary | ICD-10-CM | POA: Insufficient documentation

## 2020-04-19 DIAGNOSIS — M545 Low back pain, unspecified: Secondary | ICD-10-CM | POA: Insufficient documentation

## 2020-04-19 LAB — CBC WITH DIFFERENTIAL/PLATELET
Abs Immature Granulocytes: 0.06 10*3/uL (ref 0.00–0.07)
Basophils Absolute: 0 10*3/uL (ref 0.0–0.1)
Basophils Relative: 0 %
Eosinophils Absolute: 0.1 10*3/uL (ref 0.0–0.5)
Eosinophils Relative: 1 %
HCT: 41.9 % (ref 39.0–52.0)
Hemoglobin: 15.3 g/dL (ref 13.0–17.0)
Immature Granulocytes: 1 %
Lymphocytes Relative: 16 %
Lymphs Abs: 1.9 10*3/uL (ref 0.7–4.0)
MCH: 33 pg (ref 26.0–34.0)
MCHC: 36.5 g/dL — ABNORMAL HIGH (ref 30.0–36.0)
MCV: 90.5 fL (ref 80.0–100.0)
Monocytes Absolute: 1.3 10*3/uL — ABNORMAL HIGH (ref 0.1–1.0)
Monocytes Relative: 10 %
Neutro Abs: 8.9 10*3/uL — ABNORMAL HIGH (ref 1.7–7.7)
Neutrophils Relative %: 72 %
Platelets: 198 10*3/uL (ref 150–400)
RBC: 4.63 MIL/uL (ref 4.22–5.81)
RDW: 12 % (ref 11.5–15.5)
WBC: 12.3 10*3/uL — ABNORMAL HIGH (ref 4.0–10.5)
nRBC: 0 % (ref 0.0–0.2)

## 2020-04-19 LAB — CBG MONITORING, ED: Glucose-Capillary: 301 mg/dL — ABNORMAL HIGH (ref 70–99)

## 2020-04-19 LAB — COMPREHENSIVE METABOLIC PANEL
ALT: 27 U/L (ref 0–44)
AST: 24 U/L (ref 15–41)
Albumin: 3.2 g/dL — ABNORMAL LOW (ref 3.5–5.0)
Alkaline Phosphatase: 87 U/L (ref 38–126)
Anion gap: 12 (ref 5–15)
BUN: 7 mg/dL — ABNORMAL LOW (ref 8–23)
CO2: 22 mmol/L (ref 22–32)
Calcium: 9.1 mg/dL (ref 8.9–10.3)
Chloride: 101 mmol/L (ref 98–111)
Creatinine, Ser: 0.93 mg/dL (ref 0.61–1.24)
GFR, Estimated: >60 mL/min (ref >60)
Glucose, Bld: 317 mg/dL — ABNORMAL HIGH (ref 70–99)
Potassium: 3.8 mmol/L (ref 3.5–5.1)
Sodium: 135 mmol/L (ref 135–145)
Total Bilirubin: 1 mg/dL (ref 0.3–1.2)
Total Protein: 5.9 g/dL — ABNORMAL LOW (ref 6.5–8.1)

## 2020-04-19 LAB — URINALYSIS, ROUTINE W REFLEX MICROSCOPIC
Bacteria, UA: NONE SEEN
Bilirubin Urine: NEGATIVE
Glucose, UA: >=500 mg/dL — AB
Hgb urine dipstick: NEGATIVE
Ketones, ur: NEGATIVE mg/dL
Leukocytes,Ua: NEGATIVE
Nitrite: NEGATIVE
Protein, ur: NEGATIVE mg/dL
Specific Gravity, Urine: 1.015 (ref 1.005–1.030)
pH: 6 (ref 5.0–8.0)

## 2020-04-19 MED ORDER — GLIPIZIDE 10 MG PO TABS: 10.0000 mg | ORAL_TABLET | Freq: Two times a day (BID) | ORAL | 0 refills | Status: DC

## 2020-04-19 MED ORDER — KETOROLAC TROMETHAMINE 15 MG/ML IJ SOLN
15.0000 mg | Freq: Once | INTRAMUSCULAR | Status: AC
  Administered 2020-04-19: 15 mg via INTRAVENOUS
  Filled 2020-04-19: qty 1

## 2020-04-19 MED ORDER — ALBUTEROL SULFATE HFA 108 (90 BASE) MCG/ACT IN AERS: 1.0000 | INHALATION_SPRAY | Freq: Four times a day (QID) | RESPIRATORY_TRACT | 0 refills | Status: DC | PRN

## 2020-04-19 MED ORDER — LIDOCAINE 5 % EX PTCH: 1.0000 | MEDICATED_PATCH | CUTANEOUS | 0 refills | Status: DC

## 2020-04-19 MED ORDER — ATORVASTATIN CALCIUM 40 MG PO TABS: 40.0000 mg | ORAL_TABLET | Freq: Every day | ORAL | 0 refills | Status: DC

## 2020-04-19 MED ORDER — NAPROXEN 500 MG PO TABS: 500.0000 mg | ORAL_TABLET | Freq: Two times a day (BID) | ORAL | 0 refills | Status: DC

## 2020-04-19 MED ORDER — PANTOPRAZOLE SODIUM 20 MG PO TBEC: 20.0000 mg | DELAYED_RELEASE_TABLET | Freq: Every day | ORAL | 0 refills | Status: DC

## 2020-04-19 MED ORDER — INSULIN GLARGINE 100 UNIT/ML ~~LOC~~ SOLN: 15.0000 [IU] | Freq: Two times a day (BID) | SUBCUTANEOUS | 0 refills | Status: DC

## 2020-04-19 MED ORDER — PIOGLITAZONE HCL 30 MG PO TABS: 30.0000 mg | ORAL_TABLET | Freq: Every day | ORAL | 0 refills | Status: DC

## 2020-04-19 MED ORDER — METHOCARBAMOL 1000 MG/10ML IJ SOLN
500.0000 mg | Freq: Once | INTRAVENOUS | Status: AC
  Administered 2020-04-19: 500 mg via INTRAVENOUS
  Filled 2020-04-19 (×2): qty 5

## 2020-04-19 MED ORDER — GABAPENTIN 100 MG PO CAPS: 100.0000 mg | ORAL_CAPSULE | Freq: Two times a day (BID) | ORAL | 0 refills | Status: DC

## 2020-04-19 MED ORDER — METHOCARBAMOL 1000 MG/10ML IJ SOLN
500.0000 mg | Freq: Once | INTRAMUSCULAR | Status: DC
  Filled 2020-04-19 (×2): qty 5

## 2020-04-19 MED ORDER — LISINOPRIL 20 MG PO TABS: 20.0000 mg | ORAL_TABLET | Freq: Every day | ORAL | 0 refills | Status: DC

## 2020-04-19 MED ORDER — METHOCARBAMOL 500 MG PO TABS: 500.0000 mg | ORAL_TABLET | Freq: Two times a day (BID) | ORAL | 0 refills | Status: DC | PRN

## 2020-04-19 NOTE — Progress Notes (Signed)
   04/19/20 2201  TOC ED Mini Assessment  TOC Time spent with patient (minutes): 45  PING Used in TOC Assessment No  Admission or Readmission Diverted Yes  Interventions which prevented an admission or readmission Transportation Screening;Homeless Screening  What brought you to the Emergency Department?  Flank pain  Barriers to Discharge No Barriers Identified  Barrier interventions CSW provided shelter resources, a winter hat and socks as well as a pullover from [employer] and 2 bus passes in order to facilitate obtaining medications from pharmacy as well as other transportation.  Means of departure Public Transportation  CSW met with Pt at bedside to provide resources for homelessness as well as winter hat and socks. CSW also added resources to AVS.

## 2020-04-19 NOTE — ED Notes (Addendum)
Called Pharmacy r/t Robaxin, they stated they are tubing it up

## 2020-04-19 NOTE — Discharge Instructions (Addendum)
Your regular home medications were refilled for 1 month.  Follow-up with Dr. Jonelle Sidle as needed for further refills.  For your back pain: Take naproxen 2 times a day with meals.  Do not take other anti-inflammatories at the same time (Advil, Motrin, ibuprofen, Aleve). You may supplement with Tylenol if you need further pain control. Use Robaxin as needed for muscle stiffness or soreness. Have caution, as this may make you tired or groggy. Do not drive or operate heavy machinery while taking this medication.  Use lidocaine patch as needed for pain.  Follow up with your primary care doctor if pain is not improving with this treatment in 2 weeks.  Return to the ER if you develop high fevers, numbness, loss of bowel or bladder control, or any new or concerning symptoms.     Facility Address Phone Ext. Comments  [facility] [address]  [place] [phone]  [facility] [place] [phone]  DV Shelter  [facility] [phone] [address] [place] [phone]  Families' w/Children   [facility] [address] [phone]  Men/Women/Families   [facility] [address]  [place] [phone] Men and Women  [facility] [phone]    pregnant/parenting girls and women  [place] [phone]  ages [place] [phone]  Youth ages 11-17  [facility] [address] [phone]  Men  [place]  [place] ([phone]  [place] [phone]  Single Women and Women w/Children  [facility] [address] [phone] Men/Women/Families   [facility] [place] [phone]  DV Shelter  [facility] [address]  [place] [phone] Men and Women  [facility] [address] [place] [phone] Men  [facility] [place] [phone] [place] [phone]  Single Women and Families  [facility] [place] [phone] or  [phone]  Pregnant Women  [facility] [place].  [place]  [phone]  Men/Women and Families   [facility] [place] [address] 8a-3p Everyday
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
## 2020-04-19 NOTE — ED Notes (Signed)
Patient refusing to leave, CN and hospital security notified. Patient escorted out in wheelchair with hospital security.

## 2020-04-19 NOTE — ED Triage Notes (Signed)
Pt homeless, arrived via EMS for left flank pain that radiates to lower left leg. Pt is also a Type 1 diabetic and has missed two days of insulin. EMS vitals 180/100, CBG 376.

## 2020-04-20 ENCOUNTER — Telehealth: Payer: Self-pay

## 2020-04-20 DIAGNOSIS — E1110 Type 2 diabetes mellitus with ketoacidosis without coma: Secondary | ICD-10-CM

## 2020-04-20 DIAGNOSIS — F1020 Alcohol dependence, uncomplicated: Secondary | ICD-10-CM
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
## 2020-04-26 ENCOUNTER — Ambulatory Visit: Payer: Self-pay

## 2020-04-26 ENCOUNTER — Other Ambulatory Visit: Payer: Self-pay

## 2020-04-26 ENCOUNTER — Encounter (INDEPENDENT_AMBULATORY_CARE_PROVIDER_SITE_OTHER): Payer: Self-pay

## 2020-04-26 ENCOUNTER — Other Ambulatory Visit: Payer: Self-pay | Admitting: *Deleted

## 2020-04-26 DIAGNOSIS — Z0000 Encounter for general adult medical examination without abnormal findings: Secondary | ICD-10-CM
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
## 2020-05-04 ENCOUNTER — Other Ambulatory Visit: Payer: Self-pay

## 2020-05-04 NOTE — Patient Instructions (Signed)
Visit Information  [name] was given information about Medicaid Managed Care team care coordination services as a part of their [facility] Community Plan Medicaid benefit. [name] verbally consented to engagement with the [facility] Managed Care team.   Please contact [facility] at [phone] to complete registration to get therapy started.   For questions related to your [facility], please call: [phone] or visit the homepage here: [email or web address]  If you would like to schedule transportation through your [facility], please call the following number at least 2 days in advance of your appointment: [phone]  Goals Addressed   None      Social Worker will follow up with patient in 7 days.Shaune Leeks
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
## 2020-05-10 ENCOUNTER — Other Ambulatory Visit: Payer: Self-pay

## 2020-05-10 NOTE — Patient Outreach (Signed)
Patient answered phone but had to stop after 20 minutes, was only able to introduce myself. Will call back next month.  Patient states he has enough meds to last for 30 days. No car/job/ and is living without shelter. Looking to get a tent and butane heater

## 2020-05-11 ENCOUNTER — Other Ambulatory Visit: Payer: Self-pay

## 2020-05-11 ENCOUNTER — Other Ambulatory Visit: Payer: Self-pay | Admitting: *Deleted
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
## 2020-05-13 ENCOUNTER — Other Ambulatory Visit: Payer: Self-pay

## 2020-05-13 ENCOUNTER — Ambulatory Visit: Payer: Self-pay
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
## 2020-05-24 ENCOUNTER — Other Ambulatory Visit: Payer: Self-pay
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
## 2020-06-01 ENCOUNTER — Other Ambulatory Visit: Payer: Self-pay

## 2020-06-02 ENCOUNTER — Other Ambulatory Visit: Payer: Self-pay

## 2020-06-02 NOTE — Patient Instructions (Signed)
Visit Information  [name]  - as a part of your Medicaid benefit, you are eligible for care management and care coordination services at no cost or copay. I was unable to reach you by phone today but would be happy to help you with your health related needs. Please feel free to call me @ [phone].   A member of the Managed Medicaid care management team will reach out to you again over the next 7 days.   Mickel Fuchs, BSW, [place]  High Risk Managed Medicaid Team

## 2020-06-02 NOTE — Patient Outreach (Signed)
Care Coordination  06/02/2020  ETHANAEL VEITH May 07, 1955 [id number]   Medicaid Managed Care   Unsuccessful Outreach Note  06/02/2020 Name: RYDGE TEXIDOR MRN: [id number] DOB: 1955/09/13  Referred by: Elwyn Reach, MD Reason for referral : High Risk Managed Medicaid (HR MM Unsuccessful Telephone Outreach)   An unsuccessful telephone outreach was attempted today. The patient was referred to the case management team for assistance with care management and care coordination.   Follow Up Plan: The care management team will reach out to the patient again over the next 7 days.   Mickel Fuchs, BSW, [place]  High Risk Managed Medicaid Team

## 2020-06-10 ENCOUNTER — Other Ambulatory Visit: Payer: Self-pay | Admitting: *Deleted

## 2020-06-10 NOTE — Patient Instructions (Signed)
Visit Information  [name]  - as a part of your Medicaid benefit, you are eligible for care management and care coordination services at no cost or copay. I was unable to reach you by phone today but would be happy to help you with your health related needs. Please feel free to call me @ [phone].   A member of the Managed Medicaid care management team will reach out to you again over the next 7-14 days.   [name] RN, BSN [place] RN Care Coordinator

## 2020-06-10 NOTE — Patient Outreach (Signed)
Care Coordination  06/10/2020  Carl Cooke 30-Aug-1955 [id number]    Medicaid Managed Care   Unsuccessful Outreach Note  06/10/2020 Name: Carl Cooke MRN: [id number] DOB: 14-Mar-1956  Referred by: Elwyn Reach, MD Reason for referral : High Risk Managed Medicaid (Unsuccessful follow up outreach)   An unsuccessful telephone outreach was attempted today. The patient was referred to the case management team for assistance with care management and care coordination.   Follow Up Plan: The care management team will reach out to the patient again over the next 7-14 days.   [name] RN, BSN [place]  [employer] manager

## 2020-06-29 ENCOUNTER — Other Ambulatory Visit: Payer: Self-pay

## 2020-08-02 ENCOUNTER — Other Ambulatory Visit: Payer: Self-pay
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
## 2020-08-04 ENCOUNTER — Other Ambulatory Visit: Payer: Self-pay

## 2020-08-04 NOTE — Patient Outreach (Signed)
Medicaid Managed Care Social Work Note  08/04/2020 Name:  Carl Cooke MRN:  [id number] DOB:  1955/10/27  Carl Cooke is an 65 y.o. year old male who is a primary patient of Garba, Henderson Newcomer, MD.  The [facility] Managed Care Coordination team was consulted for assistance with:  Transportation Needs  PCP  Carl Cooke was given information about Medicaid Managed CareCoordination services today. Carl Cooke agreed to services and verbal consent obtained.  Engaged with patient  for by telephone forfollow up visit in response to referral for case management and/or care coordination services.   Assessments/Interventions:  Review of past medical history, allergies, medications, health status, including review of consultants reports, laboratory and other test data, was performed as part of comprehensive evaluation and provision of chronic care management services.  SDOH: (Social Determinant of Health) assessments and interventions performed:  . Patient stated he is doing well. He is still homeless but is working and making 500 dollars a day. His job does require him to travel, however his boss takes care of all expenses. Patient states he is ready to get his health back in order. Nash Dimmer with [facility] and scheduled patient a new patient appointment for 08/27/20 at [place] provided patient with the telephone number and address for the appointment ([address] [place] [address] [phone]. Marland Kitchen Patient stated he may need transportation to this apointment, BSW provided patient with the phone number for [facility] transportation.    Advanced Directives Status:  Not addressed in this encounter.  Care Plan                 No Known Allergies  Medications Reviewed Today    Reviewed by [name], RN (Registered Nurse) on 05/11/20 at [phone]  Med List Status: <None>  Medication Order Taking? Sig Documenting Provider Last Dose Status Informant  acetaminophen (TYLENOL) 500 MG tablet  [id number] No Take 1,000-1,500 mg by mouth every 6 (six) hours as needed for headache (pain).  Patient not taking: No sig reported   [provider] Not Taking Active   albuterol (VENTOLIN HFA) 108 (90 Base) MCG/ACT inhaler [id number] Yes Inhale 1-2 puffs into the lungs every 6 (six) hours as needed for wheezing or shortness of breath. Caccavale, Sophia, PA-C Taking Active   atorvastatin (LIPITOR) 40 MG tablet [id number] No Take 1 tablet (40 mg total) by mouth daily.  Patient not taking: Reported on 05/11/2020   Franchot Heidelberg, PA-C Not Taking Active            Med Note (ROBB, MELANIE A   Tue May 11, 2020  9:36 AM) Needs to pick up refill  gabapentin (NEURONTIN) 100 MG capsule [id number] No Take 1 capsule (100 mg total) by mouth 2 (two) times daily.  Patient not taking: Reported on 05/11/2020   Franchot Heidelberg, PA-C Not Taking Active            Med Note (ROBB, MELANIE A   Tue May 11, 2020  9:36 AM) Needs to pick up refill  glipiZIDE (GLUCOTROL) 10 MG tablet [id number] No Take 1 tablet (10 mg total) by mouth 2 (two) times daily before a meal.  Patient not taking: Reported on 05/11/2020   Franchot Heidelberg, PA-C Not Taking Active            Med Note (ROBB, MELANIE A   Tue May 11, 2020  9:36 AM) Needs to pick up refill  ibuprofen (ADVIL) 800 MG tablet [id number] No Take  800 mg by mouth every 8 (eight) hours as needed.  Patient not taking: Reported on 05/11/2020   [provider] Not Taking Active   insulin glargine (LANTUS) 100 UNIT/ML injection [id number] Yes Inject 0.15 mLs (15 Units total) into the skin 2 (two) times daily before a meal. Caccavale, Sophia, PA-C Taking Active   lidocaine (LIDODERM) 5 % [id number] No Place 1 patch onto the skin daily. Remove & Discard patch within 12 hours or as directed by MD  Patient not taking: No sig reported   Caccavale, Sophia, PA-C Not Taking Active            Med Note (ROBB, MELANIE A   Mon Apr 26, 2020  9:17 AM) Needs to fill prescription   lisinopril (ZESTRIL) 20 MG tablet [id number] No Take 1 tablet (20 mg total) by mouth daily.  Patient not taking: Reported on 05/11/2020   Franchot Heidelberg, PA-C Not Taking Active            Med Note (ROBB, MELANIE A   Tue May 11, 2020  9:35 AM) Needs to pick up refill  methocarbamol (ROBAXIN) 500 MG tablet [id number] No Take 1 tablet (500 mg total) by mouth 2 (two) times daily as needed for muscle spasms.  Patient not taking: No sig reported   Franchot Heidelberg, PA-C Not Taking Active            Med Note (ROBB, MELANIE A   Mon Apr 26, 2020  9:17 AM) Needs to fill prescription  naproxen (NAPROSYN) 500 MG tablet [id number] No Take 1 tablet (500 mg total) by mouth 2 (two) times daily with a meal.  Patient not taking: No sig reported   Caccavale, Sophia, PA-C Not Taking Active            Med Note (ROBB, MELANIE A   Mon Apr 26, 2020  9:17 AM) Needs to fill prescription  pantoprazole (PROTONIX) 20 MG tablet [id number] No Take 1 tablet (20 mg total) by mouth daily.  Patient not taking: Reported on 05/11/2020   Franchot Heidelberg, PA-C Not Taking Active            Med Note (ROBB, MELANIE A   Tue May 11, 2020  9:35 AM) Needs to pick up refill  pioglitazone (ACTOS) 30 MG tablet [id number] No Take 1 tablet (30 mg total) by mouth daily.  Patient not taking: Reported on 05/11/2020   Franchot Heidelberg, PA-C Not Taking Active            Med Note (ROBB, MELANIE A   Tue May 11, 2020  9:34 AM) Needs to pick up refill          Patient Active Problem List   Diagnosis Date Noted  . Reflux esophagitis 07/13/2011  . Gastritis and duodenitis 07/13/2011  . Delayed gastric emptying 07/13/2011  . 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 Abdominal pain 07/12/2011  . Alcoholism ([place]) 07/12/2011  . Nausea and vomiting 07/12/2011  . Elevated LFTs 07/12/2011    Conditions to be addressed/monitored per PCP order:  Homelessness and PCP and transportation  There are no care plans that you recently modified to display for  this patient.   Follow up:  Patient agrees to Care Plan and Follow-up.  Plan: The Managed Medicaid care management team will reach out to the patient again over the next 30 days.  Date/time of next scheduled Social Work care management/care coordination outreach:  09/03/20  Mickel Fuchs, [place], [place]  [facility]  High Risk Managed Medicaid Team

## 2020-08-04 NOTE — Patient Instructions (Signed)
Visit Information  Mr. Campanaro was given information about Medicaid Managed Care team care coordination services as a part of their [place] Medicaid benefit. [name] verbally consented to engagement with the [facility] Managed Care team.   For questions related to your [facility], please call: [phone] or visit the homepage here: [email or web address]  If you would like to schedule transportation through your [facility], please call the following number at least 2 days in advance of your appointment: [phone].   Call the [place] at [phone], at any time, 24 hours a day, 7 days a week. If you are in danger or need immediate medical attention call 911.  Mr. Curt - following are the goals we discussed in your visit today:  Goals Addressed   None       Social Worker will follow up in 30 days.   Mickel Fuchs, BSW, [place]  High Risk Managed Medicaid Team    Following is a copy of your plan of care:

## 2020-08-23 ENCOUNTER — Other Ambulatory Visit: Payer: Self-pay

## 2020-08-23 ENCOUNTER — Other Ambulatory Visit: Payer: Self-pay | Admitting: *Deleted

## 2020-08-23 ENCOUNTER — Other Ambulatory Visit: Payer: Medicaid Other | Admitting: *Deleted

## 2020-08-23 NOTE — Patient Outreach (Signed)
Care Coordination  08/23/2020  Carl Cooke 07/10/1955 [id number]   RNCM called to arrange transportation provided by [facility] for [name] appointment on 08/27/20 for new patient visit with [name]. Transportation service will pick patient up at the address provided by [name]- [address] [place], [place] [address] at 12:15pm and take him to [address], [place], [place] [address] confirmation number [phone].   [name] notified to be at the above address by 12:00. [name] voiced understanding and was thankful. He is looking forward to having a new PCP.  RNCM will follow up with [name] at the next scheduled appointment on 09/13/20.   [name] RN, BSN [place]  [employer] manager
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
## 2020-08-27 ENCOUNTER — Ambulatory Visit: Payer: Medicaid Other | Admitting: Nurse Practitioner

## 2020-09-01 ENCOUNTER — Other Ambulatory Visit: Payer: Self-pay
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
## 2020-09-13 ENCOUNTER — Other Ambulatory Visit: Payer: Self-pay | Admitting: *Deleted

## 2020-09-13 NOTE — Patient Outreach (Signed)
Care Coordination  09/13/2020  Carl Cooke Dec 20, 1955 [id number]   Medicaid Managed Care   Unsuccessful Outreach Note  09/13/2020 Name: Carl Cooke MRN: [id number] DOB: 16-Jul-1955  Referred by: Elwyn Reach, MD Reason for referral : High Risk Managed Medicaid (Unsuccessful RNCM follow up outreach)   An unsuccessful telephone outreach was attempted today. The patient was referred to the case management team for assistance with care management and care coordination.   Follow Up Plan: A HIPAA compliant phone message was left for the patient providing contact information and requesting a return call.  The care management team will reach out to the patient again over the next 7-14 days.   [name] RN, BSN [place]  [employer] manager

## 2020-09-13 NOTE — Patient Instructions (Signed)
Visit Information  [name]  - as a part of your Medicaid benefit, you are eligible for care management and care coordination services at no cost or copay. I was unable to reach you by phone today but would be happy to help you with your health related needs. Please feel free to call me @ [phone].   A member of the Managed Medicaid care management team will reach out to you again over the next 7-14 days.   [name] RN, BSN [place]  [employer] manager

## 2020-09-29 ENCOUNTER — Other Ambulatory Visit: Payer: Self-pay
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
## 2020-10-05 ENCOUNTER — Other Ambulatory Visit: Payer: Self-pay

## 2020-10-05 ENCOUNTER — Other Ambulatory Visit: Payer: Medicaid Other | Admitting: *Deleted

## 2020-10-05 NOTE — Patient Outreach (Signed)
Patient trying to get in touch with Carl Cooke, sent msg to her asking her to call him back

## 2020-10-05 NOTE — Patient Outreach (Signed)
Care Coordination  10/05/2020  Carl Cooke May 25, 1955 [id number]   RNCM returning call to patient. Mr. Matters is requesting to reschedule missed new patient visit with PCP. Phone number and address given to [name] for [name] at the [facility] [phone], [place], [place], [place] [address]. RNCM also provided patient the number for medical transportation provided by [facility] [phone]. Explained to patient that he would need to provide the transportation service with Address of pick up and drop off location. [name] will call and arrange appointment. RNCM will follow up at scheduled appointment time on 10/07/20 @ 3:30pm. [name] agreed to this plan.  [name] RN, BSN [place]  [employer] manager

## 2020-10-07 ENCOUNTER — Other Ambulatory Visit: Payer: Self-pay | Admitting: *Deleted

## 2020-10-07 ENCOUNTER — Other Ambulatory Visit: Payer: Self-pay
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
## 2020-10-21 ENCOUNTER — Other Ambulatory Visit: Payer: Self-pay | Admitting: *Deleted

## 2020-10-21 ENCOUNTER — Other Ambulatory Visit: Payer: Self-pay
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
## 2020-10-29 ENCOUNTER — Other Ambulatory Visit: Payer: Self-pay

## 2020-11-09 ENCOUNTER — Other Ambulatory Visit: Payer: Self-pay

## 2020-11-09 ENCOUNTER — Other Ambulatory Visit: Payer: Self-pay | Admitting: *Deleted
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
## 2020-11-11 ENCOUNTER — Ambulatory Visit: Payer: Medicaid Other | Admitting: Nurse Practitioner

## 2020-11-16 ENCOUNTER — Other Ambulatory Visit: Payer: Self-pay

## 2020-11-16 ENCOUNTER — Other Ambulatory Visit: Payer: Self-pay | Admitting: *Deleted

## 2020-11-16 DIAGNOSIS — Z5941 Food insecurity: Secondary | ICD-10-CM
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
## 2020-11-19 ENCOUNTER — Other Ambulatory Visit: Payer: Self-pay

## 2020-11-19 ENCOUNTER — Telehealth: Payer: Self-pay | Admitting: Internal Medicine

## 2020-11-19 NOTE — Telephone Encounter (Signed)
   Telephone encounter was:  Successful.  11/19/2020 Name: Carl Cooke MRN: [id number] DOB: [birth year]  Carl Cooke is a 65 y.o. year old male who is a primary care patient of Jonelle Sidle, Henderson Newcomer, MD . The community resource team was consulted for assistance with [place] guide performed the following interventions: Patient provided with information about care guide support team and interviewed to confirm resource needs Advised pt I would mail him a list of local food banks and confirmed the address on file .  Follow Up Plan:  No further follow up planned at this time. The patient has been provided with needed resources.  April Green Care Guide, Embedded Care Coordination Point [place], Care Management Phone: [phone] Email: [email or web address]
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
## 2020-12-15 ENCOUNTER — Emergency Department (HOSPITAL_COMMUNITY): Payer: Medicaid Other

## 2020-12-15 ENCOUNTER — Encounter (HOSPITAL_COMMUNITY): Payer: Self-pay

## 2020-12-15 ENCOUNTER — Emergency Department (HOSPITAL_COMMUNITY)
Admission: EM | Admit: 2020-12-15 | Discharge: 2020-12-15 | Payer: Medicaid Other | Attending: Emergency Medicine | Admitting: Emergency Medicine

## 2020-12-15 ENCOUNTER — Other Ambulatory Visit: Payer: Self-pay

## 2020-12-15 DIAGNOSIS — Z0471 Encounter for examination and observation following alleged adult physical abuse: Secondary | ICD-10-CM | POA: Diagnosis not present

## 2020-12-15 DIAGNOSIS — E1110 Type 2 diabetes mellitus with ketoacidosis without coma: Secondary | ICD-10-CM | POA: Insufficient documentation

## 2020-12-15 DIAGNOSIS — R55 Syncope and collapse: Secondary | ICD-10-CM | POA: Insufficient documentation

## 2020-12-15 DIAGNOSIS — R791 Abnormal coagulation profile: Secondary | ICD-10-CM | POA: Diagnosis not present

## 2020-12-15 DIAGNOSIS — Z7984 Long term (current) use of oral hypoglycemic drugs: Secondary | ICD-10-CM | POA: Diagnosis not present

## 2020-12-15 DIAGNOSIS — J449 Chronic obstructive pulmonary disease, unspecified: Secondary | ICD-10-CM | POA: Insufficient documentation

## 2020-12-15 DIAGNOSIS — Z20822 Contact with and (suspected) exposure to covid-19: Secondary | ICD-10-CM | POA: Diagnosis not present

## 2020-12-15 DIAGNOSIS — Z794 Long term (current) use of insulin: Secondary | ICD-10-CM | POA: Insufficient documentation

## 2020-12-15 DIAGNOSIS — R609 Edema, unspecified: Secondary | ICD-10-CM | POA: Diagnosis not present

## 2020-12-15 DIAGNOSIS — I10 Essential (primary) hypertension: Secondary | ICD-10-CM | POA: Diagnosis not present

## 2020-12-15 DIAGNOSIS — F17210 Nicotine dependence, cigarettes, uncomplicated: Secondary | ICD-10-CM | POA: Insufficient documentation

## 2020-12-15 DIAGNOSIS — H539 Unspecified visual disturbance: Secondary | ICD-10-CM | POA: Insufficient documentation

## 2020-12-15 DIAGNOSIS — Z79899 Other long term (current) drug therapy: Secondary | ICD-10-CM | POA: Diagnosis not present

## 2020-12-15 DIAGNOSIS — S060X1A Concussion with loss of consciousness of 30 minutes or less, initial encounter: Secondary | ICD-10-CM

## 2020-12-15 DIAGNOSIS — S0101XA Laceration without foreign body of scalp, initial encounter: Secondary | ICD-10-CM | POA: Diagnosis not present

## 2020-12-15 DIAGNOSIS — T7411XA Adult physical abuse, confirmed, initial encounter: Secondary | ICD-10-CM | POA: Diagnosis not present

## 2020-12-15 DIAGNOSIS — T148XXA Other injury of unspecified body region, initial encounter: Secondary | ICD-10-CM

## 2020-12-15 DIAGNOSIS — W19XXXA Unspecified fall, initial encounter: Secondary | ICD-10-CM | POA: Diagnosis not present

## 2020-12-15 DIAGNOSIS — T1490XA Injury, unspecified, initial encounter: Secondary | ICD-10-CM

## 2020-12-15 DIAGNOSIS — M546 Pain in thoracic spine: Secondary | ICD-10-CM | POA: Diagnosis not present

## 2020-12-15 DIAGNOSIS — S0990XA Unspecified injury of head, initial encounter: Secondary | ICD-10-CM | POA: Diagnosis not present

## 2020-12-15 DIAGNOSIS — Z23 Encounter for immunization: Secondary | ICD-10-CM | POA: Insufficient documentation

## 2020-12-15 DIAGNOSIS — R519 Headache, unspecified: Secondary | ICD-10-CM | POA: Diagnosis not present

## 2020-12-15 LAB — CBC
HCT: 41.5 % (ref 39.0–52.0)
Hemoglobin: 14.3 g/dL (ref 13.0–17.0)
MCH: 31.8 pg (ref 26.0–34.0)
MCHC: 34.5 g/dL (ref 30.0–36.0)
MCV: 92.2 fL (ref 80.0–100.0)
Platelets: 178 10*3/uL (ref 150–400)
RBC: 4.5 MIL/uL (ref 4.22–5.81)
RDW: 12.2 % (ref 11.5–15.5)
WBC: 6.8 10*3/uL (ref 4.0–10.5)
nRBC: 0 % (ref 0.0–0.2)

## 2020-12-15 LAB — I-STAT CHEM 8, ED
BUN: 12 mg/dL (ref 8–23)
Calcium, Ion: 1.2 mmol/L (ref 1.15–1.40)
Chloride: 105 mmol/L (ref 98–111)
Creatinine, Ser: 0.6 mg/dL — ABNORMAL LOW (ref 0.61–1.24)
Glucose, Bld: 304 mg/dL — ABNORMAL HIGH (ref 70–99)
HCT: 39 % (ref 39.0–52.0)
Hemoglobin: 13.3 g/dL (ref 13.0–17.0)
Potassium: 4 mmol/L (ref 3.5–5.1)
Sodium: 139 mmol/L (ref 135–145)
TCO2: 23 mmol/L (ref 22–32)

## 2020-12-15 LAB — COMPREHENSIVE METABOLIC PANEL
ALT: 17 U/L (ref 0–44)
AST: 19 U/L (ref 15–41)
Albumin: 3.3 g/dL — ABNORMAL LOW (ref 3.5–5.0)
Alkaline Phosphatase: 88 U/L (ref 38–126)
Anion gap: 7 (ref 5–15)
BUN: 12 mg/dL (ref 8–23)
CO2: 22 mmol/L (ref 22–32)
Calcium: 8.9 mg/dL (ref 8.9–10.3)
Chloride: 107 mmol/L (ref 98–111)
Creatinine, Ser: 0.75 mg/dL (ref 0.61–1.24)
GFR, Estimated: >60 mL/min (ref >60)
Glucose, Bld: 303 mg/dL — ABNORMAL HIGH (ref 70–99)
Potassium: 3.9 mmol/L (ref 3.5–5.1)
Sodium: 136 mmol/L (ref 135–145)
Total Bilirubin: 0.7 mg/dL (ref 0.3–1.2)
Total Protein: 5.9 g/dL — ABNORMAL LOW (ref 6.5–8.1)

## 2020-12-15 LAB — SAMPLE TO BLOOD BANK

## 2020-12-15 LAB — RESP PANEL BY RT-PCR (FLU A&B, COVID) ARPGX2
Influenza A by PCR: NEGATIVE
Influenza B by PCR: NEGATIVE
SARS Coronavirus 2 by RT PCR: NEGATIVE

## 2020-12-15 LAB — LACTIC ACID, PLASMA: Lactic Acid, Venous: 1.5 mmol/L (ref 0.5–1.9)

## 2020-12-15 LAB — ETHANOL: Alcohol, Ethyl (B): <10 mg/dL (ref <10)

## 2020-12-15 LAB — PROTIME-INR
INR: 1 (ref 0.8–1.2)
Prothrombin Time: 13.1 seconds (ref 11.4–15.2)

## 2020-12-15 MED ORDER — NAPROXEN 375 MG PO TABS: 375.0000 mg | ORAL_TABLET | Freq: Two times a day (BID) | ORAL | 0 refills | Status: DC

## 2020-12-15 MED ORDER — FENTANYL CITRATE PF 50 MCG/ML IJ SOSY
50.0000 ug | PREFILLED_SYRINGE | Freq: Once | INTRAMUSCULAR | Status: AC
Start: 2020-12-15 — End: 2020-12-15
  Administered 2020-12-15: 50 ug via INTRAVENOUS
  Filled 2020-12-15: qty 1

## 2020-12-15 MED ORDER — MECLIZINE HCL 25 MG PO TABS: 12.5000 mg | ORAL_TABLET | Freq: Three times a day (TID) | ORAL | 0 refills | Status: DC | PRN

## 2020-12-15 MED ORDER — TETANUS-DIPHTH-ACELL PERTUSSIS 5-2.5-18.5 LF-MCG/0.5 IM SUSY
0.5000 mL | PREFILLED_SYRINGE | Freq: Once | INTRAMUSCULAR | Status: AC
Start: 2020-12-15 — End: 2020-12-15
  Administered 2020-12-15: 0.5 mL via INTRAMUSCULAR
  Filled 2020-12-15: qty 0.5

## 2020-12-15 MED ORDER — FENTANYL CITRATE PF 50 MCG/ML IJ SOSY
50.0000 ug | PREFILLED_SYRINGE | Freq: Once | INTRAMUSCULAR | Status: AC
  Administered 2020-12-15: 50 ug via INTRAVENOUS
  Filled 2020-12-15: qty 1

## 2020-12-15 MED ORDER — LIDOCAINE-EPINEPHRINE 1 %-1:100000 IJ SOLN
20.0000 mL | Freq: Once | INTRAMUSCULAR | Status: AC
  Administered 2020-12-15: 20 mL via INTRADERMAL
  Filled 2020-12-15: qty 1

## 2020-12-15 MED ORDER — ONDANSETRON HCL 4 MG/2ML IJ SOLN
4.0000 mg | Freq: Once | INTRAMUSCULAR | Status: AC
  Administered 2020-12-15: 4 mg via INTRAVENOUS
  Filled 2020-12-15: qty 2
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
## 2020-12-15 NOTE — Discharge Instructions (Addendum)
Patient will need medications as prescribed for treatment of Pain and dizziness associated with concussion. WOUND CARE Please have your stitches/staples removed in 7 days or sooner if you have concerns. You may do this at any available urgent care or at your primary care doctor's office.  Keep area clean and dry for 24 hours. Do not remove bandage, if applied.  After 24 hours, remove bandage and wash wound gently with mild soap and warm water. Reapply a new bandage after cleaning wound, if directed.  Continue daily cleansing with soap and water until stitches/staples are removed.  Do not apply any ointments or creams to the wound while stitches/staples are in place, as this may cause delayed healing.  Seek medical careif you experience any of the following signs of infection: Swelling, redness, pus drainage, streaking, fever >101.0 F  Seek care if you experience excessive bleeding that does not stop after 15-20 minutes of constant, firm pressure.  Get help right away if: You have new or worsening physical symptoms, such as: A severe or worsening headache. Weakness or numbness in any part of your body, slurred speech, vision changes, or confusion. Your coordination gets worse. Vomiting repeatedly. You have a seizure. You have unusual behavior changes. You lose consciousness, are sleepier than normal, or are difficult to wake up.
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
## 2020-12-15 NOTE — ED Notes (Signed)
NT cleaned pt head and wrapped it with gauze. MD made aware. Sheriff at bedside.

## 2020-12-16 ENCOUNTER — Telehealth: Payer: Self-pay

## 2020-12-16 ENCOUNTER — Other Ambulatory Visit: Payer: Self-pay | Admitting: *Deleted

## 2020-12-16 NOTE — Patient Outreach (Signed)
Care Coordination  12/16/2020  Toole JANK February 06, 1956 [id number]   Medicaid Managed Care   Unsuccessful Outreach Note  12/16/2020 Name: VICTORIA FIORITO MRN: [id number] DOB: August 01, 1955  Referred by: Elwyn Reach, MD Reason for referral : High Risk Managed Medicaid (Unsuccessful RNCM follow up outreach)   An unsuccessful telephone outreach was attempted today. The patient was referred to the case management team for assistance with care management and care coordination.   Follow Up Plan: A HIPAA compliant phone message was left for the patient providing contact information and requesting a return call.   [name] RN, BSN [place]  [employer] manager

## 2020-12-16 NOTE — Telephone Encounter (Signed)
Transition Care Management Unsuccessful Follow-up Telephone Call  Date of discharge and from where:  12/15/2020-[place]   Attempts:  1st Attempt  Reason for unsuccessful TCM follow-up call:  Left voice message

## 2020-12-16 NOTE — Patient Instructions (Signed)
Visit Information  [name]  - as a part of your Medicaid benefit, you are eligible for care management and care coordination services at no cost or copay. I was unable to reach you by phone today but would be happy to help you with your health related needs. Please feel free to call me @ [phone].   A member of the Managed Medicaid care management team will reach out to you again over the next 21 days.   [name] RN, BSN [place]  [employer] manager

## 2020-12-17 NOTE — Telephone Encounter (Signed)
Transition Care Management Follow-up Telephone Call Date of discharge and from where: 12/15/2020-[facility]  How have you been since you were released from the hospital? Patient stated he is doing ok.  Any questions or concerns? No  Items Reviewed: Did the pt receive and understand the discharge instructions provided? Yes  Medications obtained and verified? Yes  Other? No  Any new allergies since your discharge? No  Dietary orders reviewed? N/A Do you have support at home? Yes   Home Care and Equipment/Supplies: Were home health services ordered? not applicable If so, what is the name of the agency? N/A  Has the agency set up a time to come to the patient's home? not applicable Were any new equipment or medical supplies ordered?  No What is the name of the medical supply agency? N/A Were you able to get the supplies/equipment? not applicable Do you have any questions related to the use of the equipment or supplies? No  Functional Questionnaire: (I = Independent and D = Dependent) ADLs: I  Bathing/Dressing- I  Meal Prep- I  Eating- I  Maintaining continence- I  Transferring/Ambulation- I  Managing Meds- I  Follow up appointments reviewed:  PCP Hospital f/u appt confirmed? Yes  Scheduled to see Crystal King,NP on 01/26/2021 @ 2:00 PM. [facility] f/u appt confirmed? No   Are transportation arrangements needed? No  If their condition worsens, is the pt aware to call PCP or go to the Emergency Dept.? Yes Was the patient provided with contact information for the PCP's office or ED? Yes Was to pt encouraged to call back with questions or concerns? Yes

## 2021-01-05 ENCOUNTER — Other Ambulatory Visit: Payer: Self-pay | Admitting: *Deleted

## 2021-01-05 ENCOUNTER — Other Ambulatory Visit: Payer: Self-pay
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
## 2021-01-12 ENCOUNTER — Encounter: Payer: Self-pay | Admitting: Nurse Practitioner

## 2021-01-12 ENCOUNTER — Ambulatory Visit (INDEPENDENT_AMBULATORY_CARE_PROVIDER_SITE_OTHER): Payer: Medicaid Other | Admitting: Nurse Practitioner

## 2021-01-12 ENCOUNTER — Other Ambulatory Visit: Payer: Self-pay

## 2021-01-12 ENCOUNTER — Telehealth: Payer: Self-pay | Admitting: *Deleted

## 2021-01-12 VITALS — BP 124/62 | HR 70 | Temp 97.4°F | Ht 68.0 in | Wt 166.0 lb

## 2021-01-12 DIAGNOSIS — M545 Low back pain, unspecified: Secondary | ICD-10-CM | POA: Diagnosis not present

## 2021-01-12 DIAGNOSIS — Z0000 Encounter for general adult medical examination without abnormal findings: Secondary | ICD-10-CM

## 2021-01-12 DIAGNOSIS — H539 Unspecified visual disturbance: Secondary | ICD-10-CM

## 2021-01-12 DIAGNOSIS — E7849 Other hyperlipidemia: Secondary | ICD-10-CM | POA: Diagnosis not present

## 2021-01-12 DIAGNOSIS — Z131 Encounter for screening for diabetes mellitus: Secondary | ICD-10-CM

## 2021-01-12 DIAGNOSIS — K2100 Gastro-esophageal reflux disease with esophagitis, without bleeding: Secondary | ICD-10-CM | POA: Diagnosis not present

## 2021-01-12 DIAGNOSIS — E1142 Type 2 diabetes mellitus with diabetic polyneuropathy: Secondary | ICD-10-CM | POA: Diagnosis not present

## 2021-01-12 DIAGNOSIS — E119 Type 2 diabetes mellitus without complications: Secondary | ICD-10-CM

## 2021-01-12 DIAGNOSIS — S0101XD Laceration without foreign body of scalp, subsequent encounter: Secondary | ICD-10-CM

## 2021-01-12 LAB — POCT GLYCOSYLATED HEMOGLOBIN (HGB A1C)
HbA1c POC (<> result, manual entry): 8.4 % (ref 4.0–5.6)
HbA1c, POC (controlled diabetic range): 8.4 % — AB (ref 0.0–7.0)
HbA1c, POC (prediabetic range): 8.4 % — AB (ref 5.7–6.4)
Hemoglobin A1C: 8.4 % — AB (ref 4.0–5.6)

## 2021-01-12 LAB — GLUCOSE, POCT (MANUAL RESULT ENTRY): POC Glucose: 278 mg/dl — AB (ref 70–99)

## 2021-01-12 LAB — POCT URINALYSIS DIP (CLINITEK)
Bilirubin, UA: NEGATIVE
Blood, UA: NEGATIVE
Glucose, UA: 500 mg/dL — AB
Ketones, POC UA: NEGATIVE mg/dL
Leukocytes, UA: NEGATIVE
Nitrite, UA: NEGATIVE
POC PROTEIN,UA: NEGATIVE
Spec Grav, UA: >=1.03 — AB (ref 1.010–1.025)
Urobilinogen, UA: 0.2 E.U./dL
pH, UA: 5.5 (ref 5.0–8.0)

## 2021-01-12 MED ORDER — ACCU-CHEK SOFTCLIX LANCETS MISC: 12 refills | Status: AC

## 2021-01-12 MED ORDER — ACCU-CHEK GUIDE W/DEVICE KIT
1.0000 | PACK | Freq: Three times a day (TID) | 3 refills | Status: AC
Start: 2021-01-12 — End: ?

## 2021-01-12 MED ORDER — LIDOCAINE 5 % EX PTCH: 1.0000 | MEDICATED_PATCH | CUTANEOUS | 0 refills | Status: AC

## 2021-01-12 MED ORDER — ACCU-CHEK GUIDE VI STRP: ORAL_STRIP | 12 refills | Status: AC

## 2021-01-12 MED ORDER — PREGABALIN 25 MG PO CAPS: 25.0000 mg | ORAL_CAPSULE | Freq: Three times a day (TID) | ORAL | 0 refills | Status: AC

## 2021-01-12 MED ORDER — METHOCARBAMOL 500 MG PO TABS: 500.0000 mg | ORAL_TABLET | Freq: Two times a day (BID) | ORAL | 0 refills | Status: DC | PRN

## 2021-01-12 MED ORDER — GLIPIZIDE 10 MG PO TABS: 10.0000 mg | ORAL_TABLET | Freq: Two times a day (BID) | ORAL | 0 refills | Status: AC

## 2021-01-12 MED ORDER — PANTOPRAZOLE SODIUM 20 MG PO TBEC
20.0000 mg | DELAYED_RELEASE_TABLET | Freq: Every day | ORAL | 0 refills | Status: AC
Start: 2021-01-12 — End: 2021-02-11

## 2021-01-12 MED ORDER — PIOGLITAZONE HCL 30 MG PO TABS: 30.0000 mg | ORAL_TABLET | Freq: Every day | ORAL | 0 refills | Status: AC

## 2021-01-12 NOTE — Patient Instructions (Addendum)
You were seen today in the [facility] to establish care and management of chronic illness. Labs were collected, results will be available via MyChart or, if abnormal, you will be contacted by clinic staff. You were prescribed medications, please take as directed. Please follow up in 1 mth for reevaluation of neuropathy. Attached below are options for ophthalmologist. Please call to schedule an appointment within the next 2 wks:  Eye Doctors That Accept Medicaid and/or [facility]  [facility] [address] [place],  [place], [place] [address] [phone] [email or web address]   [facility] [address] [place], [place] [address] [phone] [email or web address]   [place], [place], [place] [address] Located next to [employer] Phone: [place], [place], [place] [address] Located next to [employer] Phone: [phone] [email or web address]   [facility] [address], [place], [place] [address] [phone] [email or web address]   [facility] [address] [place], [place] [address] [phone] [email or web address]   [place]. [place],  [address] [phone] [email or web address]

## 2021-01-12 NOTE — Patient Outreach (Signed)
Care Coordination  01/12/2021  MANOLO SEILER 05/07/1955 [id number]  RNCM returning patient's call. Mr. Ponce is unable to find the office location of his new PCP appointment scheduled at 8:30am. RNCM explained the building that Patient Carl Cooke is located in, talked with Mr. Demarest until he was in the parking lot. RNCM called to notify the office that Mr. Reny was in the parking lot and will be heading in for his appointment. Office personnel voiced understanding.  [name] RN, BSN [place]  [employer] manager
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
## 2021-01-13 ENCOUNTER — Other Ambulatory Visit: Payer: Self-pay | Admitting: Nurse Practitioner

## 2021-01-13 LAB — CBC WITH DIFFERENTIAL/PLATELET
Basophils Absolute: 0.1 10*3/uL (ref 0.0–0.2)
Basos: 1 %
EOS (ABSOLUTE): 0.2 10*3/uL (ref 0.0–0.4)
Eos: 3 %
Hematocrit: 46.7 % (ref 37.5–51.0)
Hemoglobin: 15.8 g/dL (ref 13.0–17.7)
Immature Grans (Abs): 0 10*3/uL (ref 0.0–0.1)
Immature Granulocytes: 0 %
Lymphocytes Absolute: 2 10*3/uL (ref 0.7–3.1)
Lymphs: 25 %
MCH: 31.7 pg (ref 26.6–33.0)
MCHC: 33.8 g/dL (ref 31.5–35.7)
MCV: 94 fL (ref 79–97)
Monocytes Absolute: 0.8 10*3/uL (ref 0.1–0.9)
Monocytes: 10 %
Neutrophils Absolute: 4.9 10*3/uL (ref 1.4–7.0)
Neutrophils: 61 %
Platelets: 206 10*3/uL (ref 150–450)
RBC: 4.98 x10E6/uL (ref 4.14–5.80)
RDW: 12.1 % (ref 11.6–15.4)
WBC: 8.1 10*3/uL (ref 3.4–10.8)

## 2021-01-13 LAB — COMPREHENSIVE METABOLIC PANEL
ALT: 18 IU/L (ref 0–44)
AST: 19 IU/L (ref 0–40)
Albumin/Globulin Ratio: 1.9 (ref 1.2–2.2)
Albumin: 4.2 g/dL (ref 3.8–4.8)
Alkaline Phosphatase: 150 IU/L — ABNORMAL HIGH (ref 44–121)
BUN/Creatinine Ratio: 23 (ref 10–24)
BUN: 24 mg/dL (ref 8–27)
Bilirubin Total: 0.3 mg/dL (ref 0.0–1.2)
CO2: 21 mmol/L (ref 20–29)
Calcium: 9.8 mg/dL (ref 8.6–10.2)
Chloride: 103 mmol/L (ref 96–106)
Creatinine, Ser: 1.03 mg/dL (ref 0.76–1.27)
Globulin, Total: 2.2 g/dL (ref 1.5–4.5)
Glucose: 289 mg/dL — ABNORMAL HIGH (ref 65–99)
Potassium: 4.2 mmol/L (ref 3.5–5.2)
Sodium: 139 mmol/L (ref 134–144)
Total Protein: 6.4 g/dL (ref 6.0–8.5)
eGFR: 81 mL/min/{1.73_m2} (ref >59)

## 2021-01-13 LAB — MICROALBUMIN / CREATININE URINE RATIO
Creatinine, Urine: 159.8 mg/dL
Microalb/Creat Ratio: 22 mg/g creat (ref 0–29)
Microalbumin, Urine: 35.4 ug/mL

## 2021-01-13 LAB — LIPID PANEL
Chol/HDL Ratio: 4.1 ratio (ref 0.0–5.0)
Cholesterol, Total: 148 mg/dL (ref 100–199)
HDL: 36 mg/dL — ABNORMAL LOW (ref >39)
LDL Chol Calc (NIH): 89 mg/dL (ref 0–99)
Triglycerides: 128 mg/dL (ref 0–149)
VLDL Cholesterol Cal: 23 mg/dL (ref 5–40)

## 2021-01-18 ENCOUNTER — Other Ambulatory Visit: Payer: Self-pay | Admitting: *Deleted

## 2021-01-18 NOTE — Patient Instructions (Signed)
Visit Information  Carl Cooke  - as a part of your Medicaid benefit, you are eligible for care management and care coordination services at no cost or copay. I was unable to reach you by phone today but would be happy to help you with your health related needs. Please feel free to call me @ [phone].   A member of the Managed Medicaid care management team will reach out to you again over the next 14 days.   [name] RN, BSN [place]  [employer] manager

## 2021-01-18 NOTE — Patient Outreach (Signed)
Care Coordination  01/18/2021  DEVERICK PRUSS [birth year] [id number]   Medicaid Managed Care   Unsuccessful Outreach Note  01/18/2021 Name: Carl Cooke MRN: [id number] DOB: Jan 16, 1956  Referred by: Teena Dunk, NP Reason for referral : High Risk Managed Medicaid (Unsuccessful RNCM follow up outreach)   An unsuccessful telephone outreach was attempted today. The patient was referred to the case management team for assistance with care management and care coordination.   Follow Up Plan: A HIPAA compliant phone message was left for the patient providing contact information and requesting a return call.   [name] RN, BSN [place]  [employer] manager

## 2021-01-26 ENCOUNTER — Other Ambulatory Visit: Payer: Self-pay

## 2021-01-26 ENCOUNTER — Other Ambulatory Visit: Payer: Self-pay | Admitting: *Deleted

## 2021-01-26 ENCOUNTER — Ambulatory Visit: Payer: Medicaid Other | Admitting: Nurse Practitioner
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
## 2021-01-28 ENCOUNTER — Ambulatory Visit: Payer: Self-pay

## 2021-01-28 ENCOUNTER — Telehealth: Payer: Self-pay | Admitting: Pharmacist

## 2021-01-28 NOTE — Patient Outreach (Addendum)
01/28/2021 Name: Carl Cooke MRN: [id number] DOB: 07-07-1955  Referred by: Bo Merino I, NP Reason for referral : No chief complaint on file.   A successful telephone outreach was attempted today. Patient was unable to chat for more than a few minutes due to being busy. Able to spend a few minutes with patient discussing follow-up message from [name], RN regarding co-pay cost of patient's prescriptions. Informed patient that co-payment costs cannot be lowered due to patient having medicaid and therefore not being eligible for other assistance programs. Patient appreciative of providing this information. Requested a call back to re-schedule appointment for further discussion.  Follow Up Plan: The Managed Medicaid care management team will reach out to the patient again over the next 10 days to re-schedule appointment.  Hughes Better PharmD, CPP High Risk Managed Medicaid [place] [phone]

## 2021-02-11 ENCOUNTER — Encounter: Payer: Self-pay | Admitting: Nurse Practitioner

## 2021-02-11 ENCOUNTER — Other Ambulatory Visit: Payer: Self-pay

## 2021-02-11 ENCOUNTER — Ambulatory Visit (INDEPENDENT_AMBULATORY_CARE_PROVIDER_SITE_OTHER): Payer: Medicaid Other | Admitting: Nurse Practitioner

## 2021-02-11 VITALS — BP 132/68 | HR 71 | Temp 97.3°F | Ht 68.0 in | Wt 165.0 lb

## 2021-02-11 DIAGNOSIS — E1142 Type 2 diabetes mellitus with diabetic polyneuropathy: Secondary | ICD-10-CM | POA: Diagnosis not present

## 2021-02-11 DIAGNOSIS — S0101XD Laceration without foreign body of scalp, subsequent encounter: Secondary | ICD-10-CM | POA: Diagnosis not present

## 2021-02-11 DIAGNOSIS — Z4802 Encounter for removal of sutures: Secondary | ICD-10-CM

## 2021-02-11 NOTE — Patient Instructions (Signed)
You were seen today in the [facility] for staple removal and follow up of chronic illness.  Please follow up in 2 mths for reevaluation. Marland Kitchen
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
## 2021-02-16 ENCOUNTER — Other Ambulatory Visit: Payer: Self-pay | Admitting: *Deleted

## 2021-02-16 ENCOUNTER — Other Ambulatory Visit: Payer: Self-pay
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
## 2021-02-17 ENCOUNTER — Telehealth: Payer: Self-pay | Admitting: Clinical

## 2021-02-17 NOTE — Telephone Encounter (Signed)
Integrated Behavioral Health Case Management Referral Note  02/17/2021 Name: Carl Cooke MRN: [id number] DOB: 30-Dec-1955 Carl Cooke is a 65 y.o. year old male who sees Pass[place], Jake Church I, NP for primary care. LCSW was consulted to assess patient's needs and assist the patient with community resources.  Interpreter: No.   Interpreter Name & Language: none  Assessment: Patient experiencing homelessness and low income. Patient referred by managed medicaid case manager, [name].  Intervention: Called patient to follow up. Patient confirmed he is seeking permanent housing. He has income from [employer]. Advised patient that he is eligible to apply for project based voucher waiting list with [employer]. Was not able to give patient the information, as call was disconnected. Called patient back and he answered but call was then disconnected again. CSW to follow.  Review of patient status, including review of consultants reports, relevant laboratory and other test results, and collaboration with appropriate care team members and the patient's provider was performed as part of comprehensive patient evaluation and provision of services.    Estanislado Emms, [facility] [phone]

## 2021-02-21 ENCOUNTER — Telehealth: Payer: Self-pay | Admitting: Clinical

## 2021-02-21 NOTE — Telephone Encounter (Signed)
Integrated Behavioral Health General Follow Up Note  02/21/2021 Name: Carl Cooke MRN: [id number] DOB: 12-22-1955 Carl Cooke is a 65 y.o. year old male who sees Passmore, Jake Church I, NP for primary care.  LCSW was consulted to assess patient's needs and assist the patient with community resources.  Interpreter: No.   Interpreter Name & Language: none  Assessment: Patient experiencing homelessness and low income. Patient referred by managed Medicaid case manager, [name].  Ongoing Intervention: Today CSW called patient to follow up from call last week that was disconnected. Provided patient with information on applying for housing, though patient declined to apply for public housing through [employer]. He would like to apply for senior housing and requested information By mail. Mailed patient information on housing and food resources. Also advised patient of winter shelter options through the [facility]). Patient indicated he is aware of that resource but prefers not to utilize it as he doesn't like being in crowded spaces. He is currently staying on a friend's property and working to fix up the place in hopes of renting it from the friend. Advised patient to contact CSW with additional questions.  Review of patient status, including review of consultants reports, relevant laboratory and other test results, and collaboration with appropriate care team members and the patient's provider was performed as part of comprehensive patient evaluation and provision of services.    Estanislado Emms, [facility] [phone]

## 2021-02-22 ENCOUNTER — Ambulatory Visit: Payer: Self-pay

## 2021-04-13 ENCOUNTER — Ambulatory Visit: Payer: Medicare Other | Admitting: Nurse Practitioner

## 2022-05-31 ENCOUNTER — Encounter (HOSPITAL_COMMUNITY): Payer: Self-pay

## 2022-05-31 ENCOUNTER — Other Ambulatory Visit: Payer: Self-pay

## 2022-05-31 ENCOUNTER — Emergency Department (HOSPITAL_COMMUNITY)
Admission: EM | Admit: 2022-05-31 | Discharge: 2022-05-31 | Disposition: A | Discharge status: Home / Self Care | Payer: 59 | Attending: Emergency Medicine | Admitting: Emergency Medicine

## 2022-05-31 ENCOUNTER — Emergency Department (HOSPITAL_COMMUNITY): Payer: 59

## 2022-05-31 DIAGNOSIS — R7309 Other abnormal glucose: Secondary | ICD-10-CM | POA: Diagnosis not present

## 2022-05-31 DIAGNOSIS — Z79899 Other long term (current) drug therapy: Secondary | ICD-10-CM | POA: Diagnosis not present

## 2022-05-31 DIAGNOSIS — S59912A Unspecified injury of left forearm, initial encounter: Secondary | ICD-10-CM | POA: Diagnosis present

## 2022-05-31 DIAGNOSIS — I10 Essential (primary) hypertension: Secondary | ICD-10-CM | POA: Insufficient documentation

## 2022-05-31 DIAGNOSIS — T07XXXA Unspecified multiple injuries, initial encounter: Secondary | ICD-10-CM

## 2022-05-31 DIAGNOSIS — E119 Type 2 diabetes mellitus without complications: Secondary | ICD-10-CM | POA: Insufficient documentation

## 2022-05-31 DIAGNOSIS — S5012XA Contusion of left forearm, initial encounter: Secondary | ICD-10-CM | POA: Diagnosis not present

## 2022-05-31 DIAGNOSIS — J449 Chronic obstructive pulmonary disease, unspecified: Secondary | ICD-10-CM | POA: Insufficient documentation

## 2022-05-31 DIAGNOSIS — Z7984 Long term (current) use of oral hypoglycemic drugs: Secondary | ICD-10-CM | POA: Insufficient documentation

## 2022-05-31 DIAGNOSIS — S20222A Contusion of left back wall of thorax, initial encounter: Secondary | ICD-10-CM | POA: Diagnosis not present

## 2022-05-31 DIAGNOSIS — S40012A Contusion of left shoulder, initial encounter: Secondary | ICD-10-CM | POA: Insufficient documentation

## 2022-05-31 LAB — CBG MONITORING, ED: Glucose-Capillary: 172 mg/dL — ABNORMAL HIGH (ref 70–99)

## 2022-05-31 MED ORDER — ACETAMINOPHEN 500 MG PO TABS
1000.0000 mg | ORAL_TABLET | Freq: Once | ORAL | Status: AC
  Administered 2022-05-31: 1000 mg via ORAL
  Filled 2022-05-31: qty 2
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
## 2022-05-31 NOTE — ED Notes (Signed)
Provided pt cup of coffee

## 2022-05-31 NOTE — Discharge Instructions (Signed)
X-ray did not show any signs of fracture.  Take Tylenol as needed for aches and pains.
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
## 2022-05-31 NOTE — ED Notes (Signed)
CSI at bedside.
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
## 2022-06-06 ENCOUNTER — Inpatient Hospital Stay (INDEPENDENT_AMBULATORY_CARE_PROVIDER_SITE_OTHER): Payer: 59 | Admitting: Primary Care

## 2022-06-07 ENCOUNTER — Telehealth: Payer: Self-pay

## 2022-06-07 NOTE — Telephone Encounter (Signed)
     Patient  visit on 1/31  at [facility]  Patient is doing better but hasnt had a follow up visit with PCP due to no transportation. Patient has transporation benefits with [employer] but hasnt received his cards yet. I offered to give him the information and he didnt have time to take the information.   Have you been able to follow up with your primary care physician? No    The patient was or was not able to obtain any needed medicine or equipment. Yes   Are there diet recommendations that you are having difficulty following? Na   Patient expresses understanding of discharge instructions and education provided has no other needs at this time. Yes       [facility] [phone] [address], [place], [place] [address] Phone: [phone] Email: [email or web address]

## 2023-02-07 ENCOUNTER — Emergency Department (HOSPITAL_COMMUNITY)
Admission: EM | Admit: 2023-02-07 | Discharge: 2023-02-08 | Discharge status: Against Medical Advice | Payer: Medicare HMO | Attending: Emergency Medicine | Admitting: Emergency Medicine

## 2023-02-07 ENCOUNTER — Encounter (HOSPITAL_COMMUNITY): Payer: Self-pay | Admitting: Emergency Medicine

## 2023-02-07 ENCOUNTER — Emergency Department (HOSPITAL_COMMUNITY): Payer: Medicare HMO

## 2023-02-07 DIAGNOSIS — Z5321 Procedure and treatment not carried out due to patient leaving prior to being seen by health care provider: Secondary | ICD-10-CM | POA: Diagnosis not present

## 2023-02-07 DIAGNOSIS — S22059A Unspecified fracture of T5-T6 vertebra, initial encounter for closed fracture: Secondary | ICD-10-CM | POA: Diagnosis not present

## 2023-02-07 DIAGNOSIS — F17200 Nicotine dependence, unspecified, uncomplicated: Secondary | ICD-10-CM | POA: Insufficient documentation

## 2023-02-07 DIAGNOSIS — R0781 Pleurodynia: Secondary | ICD-10-CM | POA: Diagnosis not present

## 2023-02-07 DIAGNOSIS — I700 Atherosclerosis of aorta: Secondary | ICD-10-CM | POA: Diagnosis not present

## 2023-02-07 DIAGNOSIS — E119 Type 2 diabetes mellitus without complications: Secondary | ICD-10-CM | POA: Insufficient documentation
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
## 2023-02-08 NOTE — ED Notes (Signed)
Pt stated he's leaving and going to urgent care in the morning.

## 2023-02-09 DIAGNOSIS — K219 Gastro-esophageal reflux disease without esophagitis: Secondary | ICD-10-CM | POA: Diagnosis not present

## 2023-02-09 DIAGNOSIS — Z1159 Encounter for screening for other viral diseases: Secondary | ICD-10-CM | POA: Diagnosis not present

## 2023-02-09 DIAGNOSIS — Z125 Encounter for screening for malignant neoplasm of prostate: Secondary | ICD-10-CM | POA: Diagnosis not present

## 2023-02-09 DIAGNOSIS — Z79899 Other long term (current) drug therapy: Secondary | ICD-10-CM | POA: Diagnosis not present

## 2023-02-09 DIAGNOSIS — Z0000 Encounter for general adult medical examination without abnormal findings: Secondary | ICD-10-CM | POA: Diagnosis not present

## 2023-02-09 DIAGNOSIS — E1140 Type 2 diabetes mellitus with diabetic neuropathy, unspecified: Secondary | ICD-10-CM | POA: Diagnosis not present

## 2023-02-09 DIAGNOSIS — I10 Essential (primary) hypertension: Secondary | ICD-10-CM | POA: Diagnosis not present

## 2023-02-09 DIAGNOSIS — Z136 Encounter for screening for cardiovascular disorders: Secondary | ICD-10-CM | POA: Diagnosis not present

## 2023-02-09 DIAGNOSIS — F17210 Nicotine dependence, cigarettes, uncomplicated: Secondary | ICD-10-CM | POA: Diagnosis not present

## 2023-02-09 DIAGNOSIS — J449 Chronic obstructive pulmonary disease, unspecified: Secondary | ICD-10-CM | POA: Diagnosis not present

## 2023-02-16 DIAGNOSIS — J449 Chronic obstructive pulmonary disease, unspecified: Secondary | ICD-10-CM | POA: Diagnosis not present

## 2023-02-16 DIAGNOSIS — E1165 Type 2 diabetes mellitus with hyperglycemia: Secondary | ICD-10-CM | POA: Diagnosis not present

## 2023-02-16 DIAGNOSIS — I700 Atherosclerosis of aorta: Secondary | ICD-10-CM | POA: Diagnosis not present

## 2023-02-16 DIAGNOSIS — Z0000 Encounter for general adult medical examination without abnormal findings: Secondary | ICD-10-CM | POA: Diagnosis not present

## 2023-02-16 DIAGNOSIS — F17210 Nicotine dependence, cigarettes, uncomplicated: Secondary | ICD-10-CM | POA: Diagnosis not present

## 2023-02-16 DIAGNOSIS — E1142 Type 2 diabetes mellitus with diabetic polyneuropathy: Secondary | ICD-10-CM | POA: Diagnosis not present

## 2023-02-16 DIAGNOSIS — D72829 Elevated white blood cell count, unspecified: Secondary | ICD-10-CM | POA: Diagnosis not present

## 2023-02-16 DIAGNOSIS — Z0001 Encounter for general adult medical examination with abnormal findings: Secondary | ICD-10-CM | POA: Diagnosis not present

## 2023-02-16 DIAGNOSIS — F1021 Alcohol dependence, in remission: Secondary | ICD-10-CM | POA: Diagnosis not present

## 2023-02-16 DIAGNOSIS — Z79899 Other long term (current) drug therapy: Secondary | ICD-10-CM | POA: Diagnosis not present

## 2023-02-16 DIAGNOSIS — E785 Hyperlipidemia, unspecified: Secondary | ICD-10-CM | POA: Diagnosis not present

## 2023-02-16 DIAGNOSIS — K219 Gastro-esophageal reflux disease without esophagitis: Secondary | ICD-10-CM | POA: Diagnosis not present

## 2023-05-18 ENCOUNTER — Emergency Department (HOSPITAL_COMMUNITY): Payer: Medicare HMO

## 2023-05-18 ENCOUNTER — Encounter (HOSPITAL_COMMUNITY): Payer: Self-pay

## 2023-05-18 ENCOUNTER — Emergency Department (HOSPITAL_COMMUNITY)
Admission: EM | Admit: 2023-05-18 | Discharge: 2023-05-18 | Disposition: A | Discharge status: Home / Self Care | Payer: Medicare HMO | Attending: Emergency Medicine | Admitting: Emergency Medicine

## 2023-05-18 DIAGNOSIS — S0993XA Unspecified injury of face, initial encounter: Secondary | ICD-10-CM | POA: Diagnosis not present

## 2023-05-18 DIAGNOSIS — S80811A Abrasion, right lower leg, initial encounter: Secondary | ICD-10-CM | POA: Diagnosis not present

## 2023-05-18 DIAGNOSIS — E119 Type 2 diabetes mellitus without complications: Secondary | ICD-10-CM | POA: Insufficient documentation

## 2023-05-18 DIAGNOSIS — S0083XA Contusion of other part of head, initial encounter: Secondary | ICD-10-CM | POA: Diagnosis not present

## 2023-05-18 DIAGNOSIS — Z23 Encounter for immunization: Secondary | ICD-10-CM | POA: Diagnosis not present

## 2023-05-18 DIAGNOSIS — I10 Essential (primary) hypertension: Secondary | ICD-10-CM | POA: Diagnosis not present

## 2023-05-18 DIAGNOSIS — T7411XA Adult physical abuse, confirmed, initial encounter: Secondary | ICD-10-CM | POA: Diagnosis not present

## 2023-05-18 DIAGNOSIS — M549 Dorsalgia, unspecified: Secondary | ICD-10-CM | POA: Insufficient documentation

## 2023-05-18 DIAGNOSIS — S0031XA Abrasion of nose, initial encounter: Secondary | ICD-10-CM | POA: Diagnosis not present

## 2023-05-18 DIAGNOSIS — S199XXA Unspecified injury of neck, initial encounter: Secondary | ICD-10-CM | POA: Diagnosis not present

## 2023-05-18 DIAGNOSIS — T07XXXA Unspecified multiple injuries, initial encounter: Secondary | ICD-10-CM

## 2023-05-18 DIAGNOSIS — S0990XA Unspecified injury of head, initial encounter: Secondary | ICD-10-CM

## 2023-05-18 DIAGNOSIS — R739 Hyperglycemia, unspecified: Secondary | ICD-10-CM | POA: Diagnosis not present

## 2023-05-18 DIAGNOSIS — Z7984 Long term (current) use of oral hypoglycemic drugs: Secondary | ICD-10-CM | POA: Diagnosis not present

## 2023-05-18 DIAGNOSIS — S80812A Abrasion, left lower leg, initial encounter: Secondary | ICD-10-CM | POA: Insufficient documentation

## 2023-05-18 DIAGNOSIS — S0033XA Contusion of nose, initial encounter: Secondary | ICD-10-CM | POA: Diagnosis not present

## 2023-05-18 MED ORDER — HYDROCODONE-ACETAMINOPHEN 5-325 MG PO TABS
1.0000 | ORAL_TABLET | Freq: Once | ORAL | Status: AC
  Administered 2023-05-18: 1 via ORAL
  Filled 2023-05-18: qty 1

## 2023-05-18 MED ORDER — PROCHLORPERAZINE EDISYLATE 10 MG/2ML IJ SOLN
10.0000 mg | Freq: Once | INTRAMUSCULAR | Status: AC
  Administered 2023-05-18: 10 mg via INTRAVENOUS
  Filled 2023-05-18: qty 2

## 2023-05-18 MED ORDER — METHOCARBAMOL 500 MG PO TABS: 500.0000 mg | ORAL_TABLET | Freq: Two times a day (BID) | ORAL | 0 refills | Status: AC

## 2023-05-18 MED ORDER — DIPHENHYDRAMINE HCL 50 MG/ML IJ SOLN
25.0000 mg | Freq: Once | INTRAMUSCULAR | Status: AC
  Administered 2023-05-18: 25 mg via INTRAVENOUS
  Filled 2023-05-18: qty 1

## 2023-05-18 MED ORDER — SODIUM CHLORIDE 0.9 % IV BOLUS
1000.0000 mL | Freq: Once | INTRAVENOUS | Status: AC
  Administered 2023-05-18: 1000 mL via INTRAVENOUS

## 2023-05-18 MED ORDER — KETOROLAC TROMETHAMINE 15 MG/ML IJ SOLN
15.0000 mg | Freq: Once | INTRAMUSCULAR | Status: AC
  Administered 2023-05-18: 15 mg via INTRAVENOUS
  Filled 2023-05-18: qty 1

## 2023-05-18 MED ORDER — TETANUS-DIPHTH-ACELL PERTUSSIS 5-2.5-18.5 LF-MCG/0.5 IM SUSY
0.5000 mL | PREFILLED_SYRINGE | Freq: Once | INTRAMUSCULAR | Status: AC
  Administered 2023-05-18: 0.5 mL via INTRAMUSCULAR
  Filled 2023-05-18: qty 0.5
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
## 2023-05-18 NOTE — Discharge Instructions (Addendum)
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
## 2023-11-27 ENCOUNTER — Ambulatory Visit: Admitting: Nurse Practitioner
